# Patient Record
Sex: Female | Born: 1950 | Marital: Married | Smoking: Never smoker
Health system: Southern US, Community
[De-identification: ages and names within clinical notes are randomized; demographics above are authoritative.]

## PROBLEM LIST (undated history)

## (undated) DIAGNOSIS — E7800 Pure hypercholesterolemia, unspecified: Secondary | ICD-10-CM

## (undated) DIAGNOSIS — E119 Type 2 diabetes mellitus without complications: Secondary | ICD-10-CM

## (undated) HISTORY — DX: Type 2 diabetes mellitus without complications: E11.9

## (undated) HISTORY — DX: Pure hypercholesterolemia, unspecified: E78.00

---

## 2013-06-18 ENCOUNTER — Ambulatory Visit (INDEPENDENT_AMBULATORY_CARE_PROVIDER_SITE_OTHER): Payer: PRIVATE HEALTH INSURANCE | Admitting: Diagnostic Neuroimaging

## 2013-06-18 ENCOUNTER — Encounter (INDEPENDENT_AMBULATORY_CARE_PROVIDER_SITE_OTHER): Payer: Self-pay

## 2013-06-18 ENCOUNTER — Encounter: Payer: Self-pay | Admitting: Diagnostic Neuroimaging

## 2013-06-18 VITALS — BP 134/95 | HR 108 | Temp 97.8°F | Ht 61.0 in | Wt 149.0 lb

## 2013-06-18 DIAGNOSIS — M5416 Radiculopathy, lumbar region: Secondary | ICD-10-CM

## 2013-06-18 DIAGNOSIS — M545 Low back pain, unspecified: Secondary | ICD-10-CM

## 2013-06-18 DIAGNOSIS — IMO0002 Reserved for concepts with insufficient information to code with codable children: Secondary | ICD-10-CM

## 2013-06-18 DIAGNOSIS — M79604 Pain in right leg: Secondary | ICD-10-CM

## 2013-06-18 DIAGNOSIS — M79609 Pain in unspecified limb: Secondary | ICD-10-CM

## 2013-06-18 MED ORDER — GABAPENTIN 300 MG PO CAPS: 300.0000 mg | ORAL_CAPSULE | Freq: Three times a day (TID) | ORAL | Status: AC

## 2013-06-18 NOTE — Progress Notes (Signed)
[facility]  PATIENT: Sophia Peterson DOB: 1950-12-06  REFERRING CLINICIAN: holt HISTORY FROM: patient and husband REASON FOR VISIT: new consult   HISTORICAL  CHIEF COMPLAINT:  Chief Complaint  Patient presents with  . Back Pain    R knee and inside of leg    HISTORY OF PRESENT ILLNESS:   63 year old female with diabetes and hyperkalemia, here for evaluation of right leg pain.  5 weeks ago patient was cleaning snow off of her [place]i with a broom. She had significant physical exertion. Later that day and the following day, she noticed significant pain in her right knee, right thigh, right hip. Patient then developed an electrical sensation in the medial aspect of the right knee. Since that time she has lost sensation in the right leg from her hip to her knee. No significant weakness. She has difficult time walking. No problems with her left leg. She has chronic low back pain, including disc bulging from 2010. She's been using ibuprofen recently. Patient was treated with cortisone injection without relief.  REVIEW OF SYSTEMS: Full 14 system review of systems performed and notable only for patient was willing swelling or legs weakness diffusely.  ALLERGIES: Allergies  Allergen Reactions  . Sulfa Antibiotics     HOME MEDICATIONS: No outpatient prescriptions prior to visit.   No facility-administered medications prior to visit.    PAST MEDICAL HISTORY: Past Medical History  Diagnosis Date  . Diabetes   . High cholesterol     PAST SURGICAL HISTORY: Past Surgical History  Procedure Laterality Date  . Cesarean section  1976, 1980    FAMILY HISTORY: Family History  Problem Relation Age of Onset  . Colon cancer Mother   . Kidney failure Sister     SOCIAL HISTORY:  History   Social History  . Marital Status: Married    Spouse Name: Marcello Moores    Number of Children: 2  . Years of Education: 12th   Occupational History  .  Other    [place] History Main Topics  . Smoking status: Never Smoker   . Smokeless tobacco: Never Used  . Alcohol Use: No  . Drug Use: No  . Sexual Activity: Not on file   Other Topics Concern  . Not on file   Social History Narrative   Patient lives at home with spouse.   Caffeine Use:1-2 cups daily     PHYSICAL EXAM  Filed Vitals:   06/18/13 1038  BP: 134/95  Pulse: 108  Temp: 97.8 F (36.6 C)  TempSrc: Oral  Height: 5\' 1"  (1.549 m)  Weight: 149 lb (67.586 kg)    Not recorded    Body mass index is 28.17 kg/(m^2).  GENERAL EXAM: Patient is in no distress; well developed, nourished and groomed; neck is supple  CARDIOVASCULAR: Regular rate and rhythm, no murmurs, no carotid bruits  NEUROLOGIC: MENTAL STATUS: awake, alert, oriented to person, place and time, recent and remote memory intact, normal attention and concentration, language fluent, comprehension intact, naming intact, fund of knowledge appropriate CRANIAL NERVE: no papilledema on fundoscopic exam, pupils equal and reactive to light, visual fields full to confrontation, extraocular muscles intact, no nystagmus, facial sensation and strength symmetric, hearing intact, palate elevates symmetrically, uvula midline, shoulder shrug symmetric, tongue midline. MOTOR: normal bulk and tone, full strength in the BUE, BLE SENSORY: DECR TO ALL MODALITIES IN RIGHT L4 DISTRIBUTION; BUE AND LLE NORMAL. COORDINATION: finger-nose-finger, fine finger movements, heel-shin normal REFLEXES: BUE  3+ (WITH SPREAD), POSITIVE HOFFMANS; RIGHT KNEE 3+, RIGHT ANKLE 4 WITH FEW BEATS CLONUS; LEFT KNEE 3, LEFT ANKLE 2. DOWN GOING TOES.  GAIT/STATION: ANTALGIC GAIT; LIMPS ON RIGHT LEG. UNSTEADY.    DIAGNOSTIC DATA (LABS, IMAGING, TESTING) - I reviewed patient records, labs, notes, testing and imaging myself where available.  No results found for this basename: WBC, HGB, HCT, MCV, PLT   No results found for this basename:  na, k, cl, co2, glucose, bun, creatinine, calcium, prot, albumin, ast, alt, alkphos, bilitot, gfrnonaa, gfraa   No results found for this basename: CHOL, HDL, LDLCALC, LDLDIRECT, TRIG, CHOLHDL   No results found for this basename: HGBA1C   No results found for this basename: VITAMINB12   No results found for this basename: TSH      ASSESSMENT AND PLAN  63 y.o. year old female here with previous history low back pain and lumbar disc bulging, now with new onset 5 weeks of right lower extremity pain and numbness. Neurologic examination notable for decreased sensation in the right leg in the L4 distribution, normal strength, diffuse hyperreflexia in the upper and lower extremities with asymmetric increased reflex in the right leg. Findings concerning for right lumbar radiculopathy with superimposed cervical myelopathy.  PLAN: - out of work for next 6 weeks - MRI scans - gabapentin - PT evaluation   Orders Placed This Encounter  Procedures  . MR Lumbar Spine Wo Contrast  . MR Cervical Spine Wo Contrast    Return in about 3 months (around 09/17/2013).    Penni Bombard, MD 03/23/4816, 56:31 AM Certified in Neurology, Neurophysiology and Neuroimaging  [facility] [address] [place], [place] [address] [phone]

## 2013-06-18 NOTE — Patient Instructions (Signed)
Continue physical therapy.  Try gabapentin 300mg  at bedtime; gradually increase to three times per day.  I will check MRI scans.

## 2013-06-19 DIAGNOSIS — M47812 Spondylosis without myelopathy or radiculopathy, cervical region: Secondary | ICD-10-CM | POA: Insufficient documentation

## 2013-06-24 ENCOUNTER — Other Ambulatory Visit: Payer: Self-pay

## 2013-06-28 ENCOUNTER — Ambulatory Visit
Admission: RE | Admit: 2013-06-28 | Discharge: 2013-06-28 | Disposition: A | Discharge status: Home / Self Care | Payer: PRIVATE HEALTH INSURANCE | Source: Ambulatory Visit | Attending: Diagnostic Neuroimaging | Admitting: Diagnostic Neuroimaging

## 2013-06-28 DIAGNOSIS — M545 Low back pain, unspecified: Secondary | ICD-10-CM

## 2013-06-28 DIAGNOSIS — M79604 Pain in right leg: Secondary | ICD-10-CM

## 2013-06-28 DIAGNOSIS — R209 Unspecified disturbances of skin sensation: Secondary | ICD-10-CM

## 2013-06-28 DIAGNOSIS — M5416 Radiculopathy, lumbar region: Secondary | ICD-10-CM

## 2013-07-02 ENCOUNTER — Telehealth: Payer: Self-pay | Admitting: Diagnostic Neuroimaging

## 2013-07-02 DIAGNOSIS — R531 Weakness: Secondary | ICD-10-CM

## 2013-07-02 DIAGNOSIS — R292 Abnormal reflex: Secondary | ICD-10-CM

## 2013-07-02 NOTE — Telephone Encounter (Signed)
Patient calling requesting results of recent MRI. Please call to advise.

## 2013-07-07 NOTE — Telephone Encounter (Signed)
MRIs show multiple areas of disc bulging and possible pinched nerves. Can proceed with conservative vs neurosurgery evaluation. Would rec conservative for now. Also, I will check MRI brain to evaluate for her "jumpy" reflexes.  Penni Bombard, MD 9/67/8938, 1:01 PM Certified in Neurology, Neurophysiology and Neuroimaging  [facility] [address] [place], [place] [address] [phone]

## 2013-07-08 ENCOUNTER — Telehealth: Payer: Self-pay | Admitting: *Deleted

## 2013-07-08 NOTE — Telephone Encounter (Signed)
Pt calling requesting MRI results. Please advise °

## 2013-07-17 NOTE — Telephone Encounter (Signed)
Pt is calling back concerning results from both of her MRI. Thanks

## 2013-07-18 NOTE — Telephone Encounter (Signed)
I called patient, but no answer. MRIs show multiple areas of disc bulging and possible pinched nerves. Can proceed with conservative vs neurosurgery evaluation. Would rec conservative for now. Also, I will check MRI brain to evaluate for her "jumpy" reflexes. Please let patient know. -VRP

## 2013-07-21 ENCOUNTER — Telehealth: Payer: Self-pay | Admitting: *Deleted

## 2013-07-21 ENCOUNTER — Encounter: Payer: PRIVATE HEALTH INSURANCE | Admitting: Radiology

## 2013-07-21 ENCOUNTER — Encounter: Payer: PRIVATE HEALTH INSURANCE | Admitting: Neurology

## 2013-07-21 NOTE — Telephone Encounter (Signed)
I called pt and relayed the results.   MRI Brain done 07-12-13 at [place] and spoke to [place], she is to fax.  Received and given to Dr. Leta Baptist.   PT order?

## 2013-07-21 NOTE — Telephone Encounter (Signed)
I spoke to pt this afternoon re:  MRI results.

## 2013-07-22 NOTE — Telephone Encounter (Signed)
I called and LMVM for pt that MRI brain results no change from CT back in 2011.  Order for PT will be placed (location in [place]?).  Pt to call back.

## 2013-07-23 ENCOUNTER — Telehealth: Payer: Self-pay | Admitting: *Deleted

## 2013-07-23 DIAGNOSIS — M79604 Pain in right leg: Secondary | ICD-10-CM

## 2013-07-23 DIAGNOSIS — M545 Low back pain, unspecified: Secondary | ICD-10-CM

## 2013-07-23 NOTE — Telephone Encounter (Signed)
Per Dr. Leta Baptist, pt may go back to work when she feels up to it.  Order placed for outpt PT.  I called pt and she is out for 6 wks.   Still waiting on her form for work.  Needs note to return to work.  I will forward message about form status to Hilda Blades S in [place].

## 2013-07-23 NOTE — Telephone Encounter (Signed)
Patient calling to let Lovey Newcomer know she would like to have PT at [facility].  She would like to know if she could return to work before PT or would doctor prefer to have her do PT first.

## 2013-07-30 ENCOUNTER — Encounter: Payer: Self-pay | Admitting: *Deleted

## 2013-09-17 ENCOUNTER — Ambulatory Visit: Payer: PRIVATE HEALTH INSURANCE | Admitting: Diagnostic Neuroimaging

## 2014-02-04 ENCOUNTER — Encounter: Payer: Self-pay | Admitting: Neurology

## 2014-02-10 ENCOUNTER — Encounter: Payer: Self-pay | Admitting: Neurology

## 2016-09-28 DIAGNOSIS — D509 Iron deficiency anemia, unspecified: Secondary | ICD-10-CM | POA: Diagnosis not present

## 2016-09-28 DIAGNOSIS — D649 Anemia, unspecified: Secondary | ICD-10-CM | POA: Diagnosis not present

## 2016-09-28 DIAGNOSIS — D500 Iron deficiency anemia secondary to blood loss (chronic): Secondary | ICD-10-CM | POA: Diagnosis not present

## 2016-09-28 DIAGNOSIS — K5730 Diverticulosis of large intestine without perforation or abscess without bleeding: Secondary | ICD-10-CM | POA: Diagnosis not present

## 2017-06-12 DIAGNOSIS — Z1339 Encounter for screening examination for other mental health and behavioral disorders: Secondary | ICD-10-CM | POA: Diagnosis not present

## 2017-06-12 DIAGNOSIS — Z9181 History of falling: Secondary | ICD-10-CM | POA: Diagnosis not present

## 2017-06-12 DIAGNOSIS — I10 Essential (primary) hypertension: Secondary | ICD-10-CM | POA: Diagnosis not present

## 2017-06-12 DIAGNOSIS — F329 Major depressive disorder, single episode, unspecified: Secondary | ICD-10-CM | POA: Diagnosis not present

## 2017-06-12 DIAGNOSIS — Z6831 Body mass index (BMI) 31.0-31.9, adult: Secondary | ICD-10-CM | POA: Diagnosis not present

## 2017-06-12 DIAGNOSIS — Z1331 Encounter for screening for depression: Secondary | ICD-10-CM | POA: Diagnosis not present

## 2017-06-12 DIAGNOSIS — E1165 Type 2 diabetes mellitus with hyperglycemia: Secondary | ICD-10-CM | POA: Diagnosis not present

## 2017-07-31 DIAGNOSIS — J209 Acute bronchitis, unspecified: Secondary | ICD-10-CM | POA: Diagnosis not present

## 2017-08-04 DIAGNOSIS — J189 Pneumonia, unspecified organism: Secondary | ICD-10-CM | POA: Diagnosis not present

## 2017-08-06 DIAGNOSIS — J189 Pneumonia, unspecified organism: Secondary | ICD-10-CM | POA: Diagnosis not present

## 2017-08-06 DIAGNOSIS — J40 Bronchitis, not specified as acute or chronic: Secondary | ICD-10-CM | POA: Diagnosis not present

## 2017-08-06 DIAGNOSIS — R918 Other nonspecific abnormal finding of lung field: Secondary | ICD-10-CM | POA: Diagnosis not present

## 2017-08-08 DIAGNOSIS — R0602 Shortness of breath: Secondary | ICD-10-CM | POA: Diagnosis not present

## 2017-08-08 DIAGNOSIS — I2699 Other pulmonary embolism without acute cor pulmonale: Secondary | ICD-10-CM | POA: Diagnosis not present

## 2017-08-08 DIAGNOSIS — R9389 Abnormal findings on diagnostic imaging of other specified body structures: Secondary | ICD-10-CM | POA: Diagnosis not present

## 2017-08-10 DIAGNOSIS — J45991 Cough variant asthma: Secondary | ICD-10-CM | POA: Diagnosis not present

## 2017-08-10 DIAGNOSIS — J301 Allergic rhinitis due to pollen: Secondary | ICD-10-CM | POA: Diagnosis not present

## 2017-08-10 DIAGNOSIS — J40 Bronchitis, not specified as acute or chronic: Secondary | ICD-10-CM | POA: Diagnosis not present

## 2017-08-14 DIAGNOSIS — E049 Nontoxic goiter, unspecified: Secondary | ICD-10-CM | POA: Diagnosis not present

## 2017-08-27 DIAGNOSIS — J45991 Cough variant asthma: Secondary | ICD-10-CM | POA: Diagnosis not present

## 2017-08-27 DIAGNOSIS — J40 Bronchitis, not specified as acute or chronic: Secondary | ICD-10-CM | POA: Diagnosis not present

## 2017-08-27 DIAGNOSIS — E041 Nontoxic single thyroid nodule: Secondary | ICD-10-CM | POA: Diagnosis not present

## 2017-12-11 DIAGNOSIS — I10 Essential (primary) hypertension: Secondary | ICD-10-CM | POA: Diagnosis not present

## 2017-12-11 DIAGNOSIS — D440 Neoplasm of uncertain behavior of thyroid gland: Secondary | ICD-10-CM | POA: Diagnosis not present

## 2017-12-11 DIAGNOSIS — E041 Nontoxic single thyroid nodule: Secondary | ICD-10-CM | POA: Diagnosis not present

## 2017-12-11 DIAGNOSIS — R490 Dysphonia: Secondary | ICD-10-CM | POA: Diagnosis not present

## 2017-12-11 DIAGNOSIS — E119 Type 2 diabetes mellitus without complications: Secondary | ICD-10-CM | POA: Diagnosis not present

## 2017-12-19 DIAGNOSIS — E041 Nontoxic single thyroid nodule: Secondary | ICD-10-CM | POA: Diagnosis not present

## 2017-12-19 DIAGNOSIS — J45991 Cough variant asthma: Secondary | ICD-10-CM | POA: Diagnosis not present

## 2017-12-27 DIAGNOSIS — E041 Nontoxic single thyroid nodule: Secondary | ICD-10-CM | POA: Diagnosis not present

## 2018-01-08 DIAGNOSIS — R490 Dysphonia: Secondary | ICD-10-CM | POA: Diagnosis not present

## 2018-01-08 DIAGNOSIS — E119 Type 2 diabetes mellitus without complications: Secondary | ICD-10-CM | POA: Diagnosis not present

## 2018-01-08 DIAGNOSIS — E041 Nontoxic single thyroid nodule: Secondary | ICD-10-CM | POA: Diagnosis not present

## 2018-01-08 DIAGNOSIS — I10 Essential (primary) hypertension: Secondary | ICD-10-CM | POA: Diagnosis not present

## 2018-01-25 DIAGNOSIS — J069 Acute upper respiratory infection, unspecified: Secondary | ICD-10-CM | POA: Diagnosis not present

## 2018-01-25 DIAGNOSIS — J029 Acute pharyngitis, unspecified: Secondary | ICD-10-CM | POA: Diagnosis not present

## 2018-01-26 DIAGNOSIS — J22 Unspecified acute lower respiratory infection: Secondary | ICD-10-CM | POA: Diagnosis not present

## 2018-01-29 DIAGNOSIS — Z1331 Encounter for screening for depression: Secondary | ICD-10-CM | POA: Diagnosis not present

## 2018-01-29 DIAGNOSIS — Z0000 Encounter for general adult medical examination without abnormal findings: Secondary | ICD-10-CM | POA: Diagnosis not present

## 2018-01-29 DIAGNOSIS — Z6830 Body mass index (BMI) 30.0-30.9, adult: Secondary | ICD-10-CM | POA: Diagnosis not present

## 2018-01-29 DIAGNOSIS — Z1339 Encounter for screening examination for other mental health and behavioral disorders: Secondary | ICD-10-CM | POA: Diagnosis not present

## 2018-01-29 DIAGNOSIS — R05 Cough: Secondary | ICD-10-CM | POA: Diagnosis not present

## 2018-01-30 DIAGNOSIS — Z01810 Encounter for preprocedural cardiovascular examination: Secondary | ICD-10-CM | POA: Diagnosis not present

## 2018-01-30 DIAGNOSIS — Z01812 Encounter for preprocedural laboratory examination: Secondary | ICD-10-CM | POA: Diagnosis not present

## 2018-01-30 DIAGNOSIS — R9431 Abnormal electrocardiogram [ECG] [EKG]: Secondary | ICD-10-CM | POA: Diagnosis not present

## 2018-02-06 DIAGNOSIS — E785 Hyperlipidemia, unspecified: Secondary | ICD-10-CM | POA: Diagnosis not present

## 2018-02-06 DIAGNOSIS — I10 Essential (primary) hypertension: Secondary | ICD-10-CM | POA: Diagnosis not present

## 2018-02-06 DIAGNOSIS — Z01810 Encounter for preprocedural cardiovascular examination: Secondary | ICD-10-CM | POA: Diagnosis not present

## 2018-02-06 DIAGNOSIS — R9431 Abnormal electrocardiogram [ECG] [EKG]: Secondary | ICD-10-CM | POA: Diagnosis not present

## 2018-02-11 DIAGNOSIS — R9431 Abnormal electrocardiogram [ECG] [EKG]: Secondary | ICD-10-CM | POA: Diagnosis not present

## 2018-03-18 DIAGNOSIS — D440 Neoplasm of uncertain behavior of thyroid gland: Secondary | ICD-10-CM | POA: Diagnosis not present

## 2018-03-18 DIAGNOSIS — I10 Essential (primary) hypertension: Secondary | ICD-10-CM | POA: Diagnosis not present

## 2018-03-18 DIAGNOSIS — J45909 Unspecified asthma, uncomplicated: Secondary | ICD-10-CM | POA: Diagnosis not present

## 2018-03-18 DIAGNOSIS — R1311 Dysphagia, oral phase: Secondary | ICD-10-CM | POA: Diagnosis not present

## 2018-03-18 DIAGNOSIS — E041 Nontoxic single thyroid nodule: Secondary | ICD-10-CM | POA: Diagnosis not present

## 2018-03-18 DIAGNOSIS — E785 Hyperlipidemia, unspecified: Secondary | ICD-10-CM | POA: Diagnosis not present

## 2018-03-18 DIAGNOSIS — E119 Type 2 diabetes mellitus without complications: Secondary | ICD-10-CM | POA: Diagnosis not present

## 2018-03-18 DIAGNOSIS — Z8673 Personal history of transient ischemic attack (TIA), and cerebral infarction without residual deficits: Secondary | ICD-10-CM | POA: Diagnosis not present

## 2018-03-18 DIAGNOSIS — R1319 Other dysphagia: Secondary | ICD-10-CM | POA: Diagnosis not present

## 2018-03-18 DIAGNOSIS — K219 Gastro-esophageal reflux disease without esophagitis: Secondary | ICD-10-CM | POA: Diagnosis not present

## 2018-03-18 DIAGNOSIS — D34 Benign neoplasm of thyroid gland: Secondary | ICD-10-CM | POA: Diagnosis not present

## 2018-03-19 DIAGNOSIS — E785 Hyperlipidemia, unspecified: Secondary | ICD-10-CM | POA: Diagnosis not present

## 2018-03-19 DIAGNOSIS — R1319 Other dysphagia: Secondary | ICD-10-CM | POA: Diagnosis not present

## 2018-03-19 DIAGNOSIS — J45909 Unspecified asthma, uncomplicated: Secondary | ICD-10-CM | POA: Diagnosis not present

## 2018-03-19 DIAGNOSIS — Z8673 Personal history of transient ischemic attack (TIA), and cerebral infarction without residual deficits: Secondary | ICD-10-CM | POA: Diagnosis not present

## 2018-03-19 DIAGNOSIS — K219 Gastro-esophageal reflux disease without esophagitis: Secondary | ICD-10-CM | POA: Diagnosis not present

## 2018-03-19 DIAGNOSIS — E119 Type 2 diabetes mellitus without complications: Secondary | ICD-10-CM | POA: Diagnosis not present

## 2018-03-19 DIAGNOSIS — I10 Essential (primary) hypertension: Secondary | ICD-10-CM | POA: Diagnosis not present

## 2018-03-19 DIAGNOSIS — E041 Nontoxic single thyroid nodule: Secondary | ICD-10-CM | POA: Diagnosis not present

## 2018-07-25 DIAGNOSIS — Z79899 Other long term (current) drug therapy: Secondary | ICD-10-CM | POA: Diagnosis not present

## 2018-07-25 DIAGNOSIS — E785 Hyperlipidemia, unspecified: Secondary | ICD-10-CM | POA: Diagnosis not present

## 2018-07-25 DIAGNOSIS — K219 Gastro-esophageal reflux disease without esophagitis: Secondary | ICD-10-CM | POA: Diagnosis not present

## 2018-07-25 DIAGNOSIS — E1169 Type 2 diabetes mellitus with other specified complication: Secondary | ICD-10-CM | POA: Diagnosis not present

## 2018-07-25 DIAGNOSIS — Z6831 Body mass index (BMI) 31.0-31.9, adult: Secondary | ICD-10-CM | POA: Diagnosis not present

## 2018-07-25 DIAGNOSIS — F329 Major depressive disorder, single episode, unspecified: Secondary | ICD-10-CM | POA: Diagnosis not present

## 2018-07-25 DIAGNOSIS — E781 Pure hyperglyceridemia: Secondary | ICD-10-CM | POA: Diagnosis not present

## 2018-07-25 DIAGNOSIS — I10 Essential (primary) hypertension: Secondary | ICD-10-CM | POA: Diagnosis not present

## 2018-07-25 DIAGNOSIS — Z1331 Encounter for screening for depression: Secondary | ICD-10-CM | POA: Diagnosis not present

## 2018-08-01 DIAGNOSIS — R258 Other abnormal involuntary movements: Secondary | ICD-10-CM | POA: Diagnosis not present

## 2018-08-01 DIAGNOSIS — Z6831 Body mass index (BMI) 31.0-31.9, adult: Secondary | ICD-10-CM | POA: Diagnosis not present

## 2018-09-17 DIAGNOSIS — I10 Essential (primary) hypertension: Secondary | ICD-10-CM | POA: Diagnosis not present

## 2018-09-17 DIAGNOSIS — E1169 Type 2 diabetes mellitus with other specified complication: Secondary | ICD-10-CM | POA: Diagnosis not present

## 2018-09-17 DIAGNOSIS — E785 Hyperlipidemia, unspecified: Secondary | ICD-10-CM | POA: Diagnosis not present

## 2018-09-21 ENCOUNTER — Inpatient Hospital Stay (HOSPITAL_COMMUNITY)
Admission: EM | Admit: 2018-09-21 | Discharge: 2018-10-19 | DRG: 023 | Disposition: E | Payer: Medicare HMO | Attending: Neurology | Admitting: Neurology

## 2018-09-21 ENCOUNTER — Emergency Department (HOSPITAL_COMMUNITY): Payer: Medicare HMO

## 2018-09-21 DIAGNOSIS — I639 Cerebral infarction, unspecified: Secondary | ICD-10-CM | POA: Diagnosis not present

## 2018-09-21 DIAGNOSIS — E441 Mild protein-calorie malnutrition: Secondary | ICD-10-CM | POA: Diagnosis present

## 2018-09-21 DIAGNOSIS — J90 Pleural effusion, not elsewhere classified: Secondary | ICD-10-CM | POA: Diagnosis not present

## 2018-09-21 DIAGNOSIS — I609 Nontraumatic subarachnoid hemorrhage, unspecified: Secondary | ICD-10-CM | POA: Diagnosis present

## 2018-09-21 DIAGNOSIS — G935 Compression of brain: Secondary | ICD-10-CM | POA: Diagnosis present

## 2018-09-21 DIAGNOSIS — Z515 Encounter for palliative care: Secondary | ICD-10-CM | POA: Diagnosis not present

## 2018-09-21 DIAGNOSIS — R4182 Altered mental status, unspecified: Secondary | ICD-10-CM | POA: Diagnosis not present

## 2018-09-21 DIAGNOSIS — Z03818 Encounter for observation for suspected exposure to other biological agents ruled out: Secondary | ICD-10-CM | POA: Diagnosis not present

## 2018-09-21 DIAGNOSIS — G911 Obstructive hydrocephalus: Secondary | ICD-10-CM | POA: Diagnosis present

## 2018-09-21 DIAGNOSIS — E1122 Type 2 diabetes mellitus with diabetic chronic kidney disease: Secondary | ICD-10-CM | POA: Diagnosis present

## 2018-09-21 DIAGNOSIS — J9600 Acute respiratory failure, unspecified whether with hypoxia or hypercapnia: Secondary | ICD-10-CM | POA: Diagnosis present

## 2018-09-21 DIAGNOSIS — I161 Hypertensive emergency: Secondary | ICD-10-CM | POA: Diagnosis present

## 2018-09-21 DIAGNOSIS — I6200 Nontraumatic subdural hemorrhage, unspecified: Secondary | ICD-10-CM | POA: Diagnosis not present

## 2018-09-21 DIAGNOSIS — I619 Nontraumatic intracerebral hemorrhage, unspecified: Secondary | ICD-10-CM | POA: Diagnosis not present

## 2018-09-21 DIAGNOSIS — E1165 Type 2 diabetes mellitus with hyperglycemia: Secondary | ICD-10-CM | POA: Diagnosis present

## 2018-09-21 DIAGNOSIS — Z800 Family history of malignant neoplasm of digestive organs: Secondary | ICD-10-CM | POA: Diagnosis not present

## 2018-09-21 DIAGNOSIS — J9690 Respiratory failure, unspecified, unspecified whether with hypoxia or hypercapnia: Secondary | ICD-10-CM

## 2018-09-21 DIAGNOSIS — G936 Cerebral edema: Secondary | ICD-10-CM | POA: Diagnosis present

## 2018-09-21 DIAGNOSIS — R402142 Coma scale, eyes open, spontaneous, at arrival to emergency department: Secondary | ICD-10-CM | POA: Diagnosis present

## 2018-09-21 DIAGNOSIS — Z66 Do not resuscitate: Secondary | ICD-10-CM | POA: Diagnosis not present

## 2018-09-21 DIAGNOSIS — R402212 Coma scale, best verbal response, none, at arrival to emergency department: Secondary | ICD-10-CM | POA: Diagnosis present

## 2018-09-21 DIAGNOSIS — Z978 Presence of other specified devices: Secondary | ICD-10-CM | POA: Diagnosis not present

## 2018-09-21 DIAGNOSIS — G919 Hydrocephalus, unspecified: Secondary | ICD-10-CM | POA: Diagnosis not present

## 2018-09-21 DIAGNOSIS — I629 Nontraumatic intracranial hemorrhage, unspecified: Secondary | ICD-10-CM | POA: Diagnosis not present

## 2018-09-21 DIAGNOSIS — Z1159 Encounter for screening for other viral diseases: Secondary | ICD-10-CM | POA: Diagnosis not present

## 2018-09-21 DIAGNOSIS — I610 Nontraumatic intracerebral hemorrhage in hemisphere, subcortical: Secondary | ICD-10-CM

## 2018-09-21 DIAGNOSIS — I615 Nontraumatic intracerebral hemorrhage, intraventricular: Principal | ICD-10-CM | POA: Diagnosis present

## 2018-09-21 DIAGNOSIS — E785 Hyperlipidemia, unspecified: Secondary | ICD-10-CM | POA: Diagnosis present

## 2018-09-21 DIAGNOSIS — G8191 Hemiplegia, unspecified affecting right dominant side: Secondary | ICD-10-CM | POA: Diagnosis present

## 2018-09-21 DIAGNOSIS — J690 Pneumonitis due to inhalation of food and vomit: Secondary | ICD-10-CM | POA: Diagnosis present

## 2018-09-21 DIAGNOSIS — I618 Other nontraumatic intracerebral hemorrhage: Secondary | ICD-10-CM | POA: Diagnosis not present

## 2018-09-21 DIAGNOSIS — Z794 Long term (current) use of insulin: Secondary | ICD-10-CM

## 2018-09-21 DIAGNOSIS — Z982 Presence of cerebrospinal fluid drainage device: Secondary | ICD-10-CM | POA: Diagnosis not present

## 2018-09-21 DIAGNOSIS — E7800 Pure hypercholesterolemia, unspecified: Secondary | ICD-10-CM | POA: Diagnosis present

## 2018-09-21 DIAGNOSIS — I129 Hypertensive chronic kidney disease with stage 1 through stage 4 chronic kidney disease, or unspecified chronic kidney disease: Secondary | ICD-10-CM | POA: Diagnosis present

## 2018-09-21 DIAGNOSIS — E876 Hypokalemia: Secondary | ICD-10-CM | POA: Diagnosis not present

## 2018-09-21 DIAGNOSIS — G9340 Encephalopathy, unspecified: Secondary | ICD-10-CM | POA: Diagnosis not present

## 2018-09-21 DIAGNOSIS — R402362 Coma scale, best motor response, obeys commands, at arrival to emergency department: Secondary | ICD-10-CM | POA: Diagnosis present

## 2018-09-21 DIAGNOSIS — R29713 NIHSS score 13: Secondary | ICD-10-CM | POA: Diagnosis present

## 2018-09-21 DIAGNOSIS — R918 Other nonspecific abnormal finding of lung field: Secondary | ICD-10-CM | POA: Diagnosis not present

## 2018-09-21 DIAGNOSIS — Z4682 Encounter for fitting and adjustment of non-vascular catheter: Secondary | ICD-10-CM | POA: Diagnosis not present

## 2018-09-21 DIAGNOSIS — G9389 Other specified disorders of brain: Secondary | ICD-10-CM | POA: Diagnosis not present

## 2018-09-21 DIAGNOSIS — I6622 Occlusion and stenosis of left posterior cerebral artery: Secondary | ICD-10-CM | POA: Diagnosis not present

## 2018-09-21 DIAGNOSIS — N189 Chronic kidney disease, unspecified: Secondary | ICD-10-CM | POA: Diagnosis present

## 2018-09-21 DIAGNOSIS — S199XXA Unspecified injury of neck, initial encounter: Secondary | ICD-10-CM | POA: Diagnosis not present

## 2018-09-21 DIAGNOSIS — I613 Nontraumatic intracerebral hemorrhage in brain stem: Secondary | ICD-10-CM | POA: Diagnosis not present

## 2018-09-21 DIAGNOSIS — Z6828 Body mass index (BMI) 28.0-28.9, adult: Secondary | ICD-10-CM

## 2018-09-21 DIAGNOSIS — R600 Localized edema: Secondary | ICD-10-CM | POA: Diagnosis not present

## 2018-09-21 DIAGNOSIS — S3993XA Unspecified injury of pelvis, initial encounter: Secondary | ICD-10-CM | POA: Diagnosis not present

## 2018-09-21 DIAGNOSIS — J9811 Atelectasis: Secondary | ICD-10-CM | POA: Diagnosis not present

## 2018-09-21 LAB — DIFFERENTIAL
Abs Immature Granulocytes: 0.07 10*3/uL (ref 0.00–0.07)
Basophils Absolute: 0 10*3/uL (ref 0.0–0.1)
Basophils Relative: 0 %
Eosinophils Absolute: 0.2 10*3/uL (ref 0.0–0.5)
Eosinophils Relative: 2 %
Immature Granulocytes: 1 %
Lymphocytes Relative: 18 %
Lymphs Abs: 2.1 10*3/uL (ref 0.7–4.0)
Monocytes Absolute: 0.5 10*3/uL (ref 0.1–1.0)
Monocytes Relative: 4 %
Neutro Abs: 9.1 10*3/uL — ABNORMAL HIGH (ref 1.7–7.7)
Neutrophils Relative %: 75 %

## 2018-09-21 LAB — COMPREHENSIVE METABOLIC PANEL
ALT: 18 U/L (ref 0–44)
AST: 24 U/L (ref 15–41)
Albumin: 4.1 g/dL (ref 3.5–5.0)
Alkaline Phosphatase: 39 U/L (ref 38–126)
Anion gap: 12 (ref 5–15)
BUN: 25 mg/dL — ABNORMAL HIGH (ref 8–23)
CO2: 21 mmol/L — ABNORMAL LOW (ref 22–32)
Calcium: 9 mg/dL (ref 8.9–10.3)
Chloride: 100 mmol/L (ref 98–111)
Creatinine, Ser: 1.18 mg/dL — ABNORMAL HIGH (ref 0.44–1.00)
GFR calc Af Amer: 56 mL/min — ABNORMAL LOW (ref >60)
GFR calc non Af Amer: 48 mL/min — ABNORMAL LOW (ref >60)
Glucose, Bld: 260 mg/dL — ABNORMAL HIGH (ref 70–99)
Potassium: 3.5 mmol/L (ref 3.5–5.1)
Sodium: 133 mmol/L — ABNORMAL LOW (ref 135–145)
Total Bilirubin: 0.6 mg/dL (ref 0.3–1.2)
Total Protein: 7.1 g/dL (ref 6.5–8.1)

## 2018-09-21 LAB — CBC
HCT: 32.1 % — ABNORMAL LOW (ref 36.0–46.0)
Hemoglobin: 10.4 g/dL — ABNORMAL LOW (ref 12.0–15.0)
MCH: 28.6 pg (ref 26.0–34.0)
MCHC: 32.4 g/dL (ref 30.0–36.0)
MCV: 88.2 fL (ref 80.0–100.0)
Platelets: 304 10*3/uL (ref 150–400)
RBC: 3.64 MIL/uL — ABNORMAL LOW (ref 3.87–5.11)
RDW: 13.2 % (ref 11.5–15.5)
WBC: 12.1 10*3/uL — ABNORMAL HIGH (ref 4.0–10.5)
nRBC: 0 % (ref 0.0–0.2)

## 2018-09-21 LAB — CBG MONITORING, ED: Glucose-Capillary: 257 mg/dL — ABNORMAL HIGH (ref 70–99)

## 2018-09-21 LAB — I-STAT CHEM 8, ED
BUN: 24 mg/dL — ABNORMAL HIGH (ref 8–23)
Calcium, Ion: 1.09 mmol/L — ABNORMAL LOW (ref 1.15–1.40)
Chloride: 101 mmol/L (ref 98–111)
Creatinine, Ser: 1.1 mg/dL — ABNORMAL HIGH (ref 0.44–1.00)
Glucose, Bld: 262 mg/dL — ABNORMAL HIGH (ref 70–99)
HCT: 33 % — ABNORMAL LOW (ref 36.0–46.0)
Hemoglobin: 11.2 g/dL — ABNORMAL LOW (ref 12.0–15.0)
Potassium: 3.6 mmol/L (ref 3.5–5.1)
Sodium: 135 mmol/L (ref 135–145)
TCO2: 24 mmol/L (ref 22–32)

## 2018-09-21 LAB — APTT: aPTT: 28 seconds (ref 24–36)

## 2018-09-21 LAB — ETHANOL: Alcohol, Ethyl (B): <10 mg/dL (ref <10)

## 2018-09-21 LAB — SARS CORONAVIRUS 2 BY RT PCR (HOSPITAL ORDER, PERFORMED IN ~~LOC~~ HOSPITAL LAB): SARS Coronavirus 2: NEGATIVE

## 2018-09-21 LAB — PROTIME-INR
INR: 0.9 (ref 0.8–1.2)
Prothrombin Time: 12.1 seconds (ref 11.4–15.2)

## 2018-09-21 MED ORDER — FENTANYL CITRATE (PF) 100 MCG/2ML IJ SOLN
INTRAMUSCULAR | Status: AC
  Administered 2018-09-21: 50 ug
  Filled 2018-09-21: qty 2

## 2018-09-21 MED ORDER — ACETAMINOPHEN 160 MG/5ML PO SOLN
650.0000 mg | ORAL | Status: DC | PRN
  Administered 2018-09-22 – 2018-09-24 (×7): 650 mg
  Filled 2018-09-21 (×7): qty 20.3

## 2018-09-21 MED ORDER — PANTOPRAZOLE SODIUM 40 MG IV SOLR
40.0000 mg | Freq: Every day | INTRAVENOUS | Status: DC
  Administered 2018-09-22 (×2): 40 mg via INTRAVENOUS
  Filled 2018-09-21 (×2): qty 40

## 2018-09-21 MED ORDER — CLEVIDIPINE BUTYRATE 0.5 MG/ML IV EMUL: 0.0000 mg/h | INTRAVENOUS | Status: DC

## 2018-09-21 MED ORDER — SENNOSIDES-DOCUSATE SODIUM 8.6-50 MG PO TABS
1.0000 | ORAL_TABLET | Freq: Two times a day (BID) | ORAL | Status: DC
  Administered 2018-09-22: 02:00:00 1 via ORAL
  Filled 2018-09-21: qty 1

## 2018-09-21 MED ORDER — ACETAMINOPHEN 650 MG RE SUPP
650.0000 mg | RECTAL | Status: DC | PRN
  Administered 2018-09-22 – 2018-09-23 (×3): 650 mg via RECTAL
  Filled 2018-09-21 (×3): qty 1

## 2018-09-21 MED ORDER — MIDAZOLAM HCL 2 MG/2ML IJ SOLN
INTRAMUSCULAR | Status: AC
  Administered 2018-09-21: 23:00:00 5 mg
  Filled 2018-09-21: qty 6

## 2018-09-21 MED ORDER — STROKE: EARLY STAGES OF RECOVERY BOOK
Freq: Once | Status: AC
  Administered 2018-09-22: 05:00:00
  Filled 2018-09-21: qty 1

## 2018-09-21 MED ORDER — LABETALOL HCL 5 MG/ML IV SOLN
20.0000 mg | Freq: Once | INTRAVENOUS | Status: AC
  Administered 2018-09-22: 20 mg via INTRAVENOUS
  Filled 2018-09-21: qty 4

## 2018-09-21 MED ORDER — MANNITOL 20 % IV SOLN
50.0000 g | Freq: Once | INTRAVENOUS | Status: AC
  Administered 2018-09-22: 50 g via INTRAVENOUS
  Filled 2018-09-21 (×3): qty 250

## 2018-09-21 MED ORDER — IOHEXOL 350 MG/ML SOLN
100.0000 mL | Freq: Once | INTRAVENOUS | Status: AC | PRN
  Administered 2018-09-21: 100 mL via INTRAVENOUS

## 2018-09-21 MED ORDER — ACETAMINOPHEN 325 MG PO TABS: 650.0000 mg | ORAL_TABLET | ORAL | Status: DC | PRN

## 2018-09-21 MED ORDER — MIDAZOLAM HCL 2 MG/2ML IJ SOLN
4.0000 mg | INTRAMUSCULAR | Status: DC | PRN
  Administered 2018-09-22: 4 mg via INTRAVENOUS

## 2018-09-21 NOTE — ED Notes (Signed)
Family updated as to patient's status by Dr. Rory Percy

## 2018-09-21 NOTE — ED Provider Notes (Signed)
Procedure Name: Intubation Date/Time: 10/11/2018 10:40 PM Performed by: Jefm Petty, MD Pre-anesthesia Checklist: Patient identified, Emergency Drugs available, Suction available and Patient being monitored Oxygen Delivery Method: Nasal cannula Preoxygenation: Pre-oxygenation with 100% oxygen Induction Type: Rapid sequence and IV induction Ventilation: Mask ventilation without difficulty Laryngoscope Size: Glidescope and 3 Grade View: Grade I Tube size: 7.5 mm Number of attempts: 1 Airway Equipment and Method: Stylet,  Rigid stylet and Video-laryngoscopy Placement Confirmation: ETT inserted through vocal cords under direct vision,  Positive ETCO2,  CO2 detector and Breath sounds checked- equal and bilateral Secured at: 22 cm Tube secured with: ETT holder Difficulty Due To: Difficulty was unanticipated Comments: The care of this patient was supervised by Dr. Lennice Sites, who agreed with the plan and management of the patient.         Jefm Petty, MD 09/20/2018 2248    Lennice Sites, DO 10/08/2018 2303

## 2018-09-21 NOTE — H&P (Signed)
STROKE ICH H&P  CC: fall, AMS  History is obtained from: Chart (at time of dictation this chart is MRN: [id number], and she has another record-[id number])  HPI: Sophia Peterson is a 68 y.o. female past medical history of hypertension, diabetes, hyperlipidemia, in her usual state of health till about 9:15 PM when she went into her garden and had a sudden onset of collapse. She was brought into the hospital via [facility] as a level 2 trauma because of having sustained a fall but on initial evaluation by the ED provider and trauma doctors, she is not moving the right side of her body as much as the left and had a gaze to the right for which a code stroke was activated after discussing with me.  Of note, she had also had a large amount of vomitus upon arrival to the ER. I saw and evaluated the patient in the trauma room-my exam as listed below. I was able to speak with the son Mr. Sophia Peterson, who was present in the waiting room and provided some history.  She lives with her husband who is elderly.  2 sons check up on her-Mr. Sophia Peterson and Mr. Sophia Peterson. Son's phone numbers: Donzetta Starch Son's phone number: Sophia Peterson- [phone]   LKW: 9:15 PM on September 21, 2018 tpa given?: no, ICH Premorbid modified Rankin scale (mRS): 0 ICH score-2  ROS:  Unable to obtain due to altered mental status.   No past medical history on file.  Obtained from the other medical record as listed above.  Hypertension, diabetes, hypercholesterolemia, back pain  No family history on file. Patient unable to provide. In the other chart- mother with colon cancer.  No other family member with neurological disease.  Social History:   has no history on file for tobacco, alcohol, and drug. Patient unable to provide at this time.  On her prior chart, no history of smoking alcohol or drug abuse.  Medications  Current Facility-Administered Medications:  .  midazolam (VERSED) 2 MG/2ML injection, , , ,  No current  outpatient medications on file. Home medication list from the other chart -Atorvastatin, fenofibrate, fluoxetine, gabapentin, hydrocodone/acetaminophen, ibuprofen, omeprazole, sitagliptin, valsartan-hydrochlorothiazide.  Exam: Current vital signs: Pulse 83   Resp (!) 21   SpO2 99%  Vital signs in last 24 hours: Pulse Rate:  [83] 83 (07/04 2233) Resp:  [21] 21 (07/04 2233) SpO2:  [99 %] 99 % (07/04 2233) FiO2 (%):  [60 %] 60 % (07/04 2233) General: Drowsy, appears stressed HEENT: Normocephalic atraumatic, neck in collar Lungs: Breathing normally but appears to have difficulty protecting airway and was debated shortly after I got a brief exam CVS: S1-S2 heard regular rate rhythm Abdomen: Nondistended nontender Neurological exam Patient is drowsy Opens eyes to noxious stimulation and verbal commands Follow some commands on the left but not on the right. Cranial nerves: Pupils equal round reactive to light, she has a right forced gaze, does not blink to threat from either side consistently, her face is symmetric. Motor exam: She is spontaneously moving the left upper and lower extremity and moves left upper extremity to command is able to hold it up for 10 seconds.  She is able to wiggle toes on left lower extremity.  No movement noticed on the right upper and lower extremity spontaneously but on noxious stimulation she does attempt to withdraw the right lower extremity and extends the right upper extremity. Sensory exam: As above Coordination cannot be tested NIH stroke scale 1a Level  of Conscious.: 1 1b LOC Questions: 2 1c LOC Commands:0 2 Best Gaze: 2 3 Visual: 0 4 Facial Palsy: 0 5a Motor Arm - left: 0 5b Motor Arm - Right: 3 6a Motor Leg - Left: 1 6b Motor Leg - Right: 3 7 Limb Ataxia: 0 8 Sensory: 0 9 Best Language: 2 10 Dysarthria: 0 11 Extinct. and Inatten.: 0 TOTAL: 13  ICH score-2   Labs I have reviewed labs in epic and the results pertinent to this consultation  are: Leukocytosis WBC 12.1, hyperglycemia glucose 262, BUN 24, creatinine 1.1 CBC    Component Value Date/Time   WBC 12.1 (H) 09/19/2018 2215   RBC 3.64 (L) 10/11/2018 2215   HGB 11.2 (L) 10/03/2018 2227   HCT 33.0 (L) 09/24/2018 2227   PLT 304 09/29/2018 2215   MCV 88.2 09/23/2018 2215   MCH 28.6 09/26/2018 2215   MCHC 32.4 09/29/2018 2215   RDW 13.2 10/13/2018 2215   LYMPHSABS 2.1 10/08/2018 2215   MONOABS 0.5 10/01/2018 2215   EOSABS 0.2 10/16/2018 2215   BASOSABS 0.0 09/27/2018 2215    CMP     Component Value Date/Time   NA 135 09/22/2018 0009   K 3.5 09/22/2018 0009   CL 101 10/18/2018 2227   CO2 21 (L) 10/02/2018 2215   GLUCOSE 262 (H) 09/23/2018 2227   BUN 24 (H) 09/24/2018 2227   CREATININE 1.10 (H) 10/06/2018 2227   CALCIUM 9.0 10/05/2018 2215   PROT 7.1 09/24/2018 2215   ALBUMIN 4.1 09/30/2018 2215   AST 24 10/03/2018 2215   ALT 18 10/04/2018 2215   ALKPHOS 39 10/17/2018 2215   BILITOT 0.6 09/24/2018 2215   GFRNONAA 48 (L) 10/15/2018 2215   GFRAA 56 (L) 09/23/2018 2215  PT 12.1, INR 0.9, APTT 28.  Imaging I have reviewed the images obtained:  CT-scan of the brain- acute left thalamic bleed with caudal extension into the midbrain as well as extension into the ventricular system and subarachnoid spaces bilaterally.  Volume 10 cc with regional edema and mild regional mass-effect.  Short-term CT follow-up recommended within the next 3 hours.  CT angios head and neck-no emergent LVO.  No underlying vascular malformation other than mildly asymmetric attenuation of left PCA which could be from vasospasm or obscuration from the blood.  Repeat MRI/MRA recommended by radiology.  Chest x-ray IMPRESSION: 1. Endotracheal tube tip about 11 mm superior to carina 2. Low lung volumes with linear atelectasis or scarring at the bases. Slightly more patchy and focal opacity in the left infrahilar lung may reflect atelectasis, pneumonia or aspiration   Assessment: Mrs.  Sophia Peterson is a 68 year old woman with hypertension hyperlipidemia and diabetes presented to the emergency room for evaluation of fall as a level 2 trauma but noticed by ED provider/trauma surgeons to not moving the right side as much as left and a gaze deviation to the right. CT head with a thalamic bleed on the left with caudal extension to the midbrain on the left as well as extension into the ventricular system and bilateral subarachnoid spaces in the basal cisterns. No underlying vascular malformation noted on CT angios head/CT with contrast.  Patient not protecting her airway and had to be intubated emergently.  Most likely a hypertensive bleed given the location and the fact that her systolic blood pressures on arrival were high 200s.   Plan: Subcortical ICH, nontraumatic IVH, nontraumatic Subarachnoid extension of ICH  Acuity: Acute Laterality: Left thalamus Current suspected etiology: Hypertension Treatment: -Admit to neurological ICU -  ICH Score: 2 -ICH Volume: 10 cc -BP control goal SYS<140 -PT/OT/ST  -neuromonitoring  CNS Cerebral edema Compression of brain -One-time mannitol bolus -Close neuro monitoring  No evidence of hydrocephalus- repeat CT in 3 hours.  Consult neurosurgery if evidence of hydrocephalus.  Dysarthria Dysphagia following ICH  -NPO until cleared by speech -ST -May need PEG  Hemiplegia and hemiparesis following nontraumatic intracerebral hemorrhage affecting right dominant side   -Continue PT/OT/ST  RESP Acute Respiratory Failure  -vent management per ICU -wean when able  CV Hypertensive Emergency -Aggressive BP control, goal SBP <140 -Labetalol PRN followed by Cleviprex drip -Echo  GI/GU Elevated creatinine-likely AKI. -Gentle hydration -Repeat BMP in the morning  HEME Iron Deficiency Anemia -Monitor -transfuse for hgb < 7  No coagulopathy on labs, normal PT/INR APTT.  ENDO Type 2 diabetes mellitus with hyperglycemia   -SSI -goal HgbA1c < 7  Fluid/Electrolyte Disorders Repeat labs in the morning Replete electrolytes as necessary  ID Possible Aspiration PNA -CXR -NPO -Monitor -Appreciate PCCM assistance in management regarding decision on antibiotics  Nutrition E66.9 Obesity  E46 Protein-Calorie Malnutrition Mild Moderate Severe -diet consult  Prophylaxis DVT: SCDs GI: PPI Bowel: Docusate senna  Dispo: To be decided  Diet: NPO until cleared by speech  Code Status: Full Code   Had a detailed discussion in the hospital with son Mr. Sophia Peterson.  Explained the gravity of the situation and chances of meaningful recovery being guarded at this time.  High likelihood of mortality given the location, hypertension, age. Son verbalized understanding.   THE FOLLOWING WERE PRESENT ON ADMISSION: ICH, cerebral edema, acute respiratory failure, possible aspiration pneumonia, hypertensive emergency, hemiplegia  -- Amie Portland, MD [facility] Pager: [phone] If 7pm to 7am, please call on call as listed on AMION.   CRITICAL CARE ATTESTATION Performed by: Amie Portland, MD Total critical care time:70 minutes Critical care time was exclusive of separately billable procedures and treating other patients and/or supervising APPs/Residents/Students Critical care was necessary to treat or prevent imminent or life-threatening deterioration due to [place], hypertensive emergency This patient is critically ill and at significant risk for neurological worsening and/or death and care requires constant monitoring. Critical care was time spent personally by me on the following activities: development of treatment plan with patient and/or surrogate as well as nursing, discussions with consultants, evaluation of patient's response to treatment, examination of patient, obtaining history from patient or surrogate, ordering and performing treatments and interventions, ordering and review of laboratory  studies, ordering and review of radiographic studies, pulse oximetry, re-evaluation of patient's condition, participation in multidisciplinary rounds and medical decision making of high complexity in the care of this patient.

## 2018-09-21 NOTE — ED Provider Notes (Signed)
[facility] EMERGENCY DEPARTMENT Provider Note   CSN: [id number] Arrival date & time: 10/07/2018  2213    History   Chief Complaint Chief Complaint  Patient presents with   Altered Mental Status    HPI Sophia Peterson is a 68 y.o. female.     Level 5 caveat due to AMS.  Per EMS, family states that patient was outside had a syncopal episode.  Did not have any significant trauma.  Went inside and had decreased consciousness.  No signs of external trauma.  Unknown if patient is on a blood thinner.  Patient is a level trauma upon arrival however appears to be a code stroke.  Patient with vomitus in her mouth.  The history is provided by the EMS personnel.  Fall This is a new problem. The current episode started 1 to 2 hours ago. The problem occurs constantly. The problem has not changed since onset.Nothing aggravates the symptoms. Nothing relieves the symptoms. She has tried nothing for the symptoms. The treatment provided no relief.    No past medical history on file.  Patient Active Problem List   Diagnosis Date Noted   ICH (intracerebral hemorrhage) ([place]) 10/05/2018    OB History   No obstetric history on file.      Home Medications    Prior to Admission medications   Not on File    Family History No family history on file.  Social History Social History   Tobacco Use   Smoking status: Not on file  Substance Use Topics   Alcohol use: Not on file   Drug use: Not on file     Allergies   Patient has no allergy information on record.   Review of Systems Review of Systems  Unable to perform ROS: Mental status change     Physical Exam Updated Vital Signs  ED Triage Vitals  Enc Vitals Group     BP 10/08/2018 2210 (!) 152/78     Pulse Rate 09/29/2018 2210 96     Resp 09/26/2018 2210 16     Temp 09/22/2018 2210 (!) 96.7 F (35.9 C)     Temp Source 10/01/2018 2210 Tympanic     SpO2 09/23/2018 2210 96 %     Weight 09/23/2018 2215 132 lb 4.4  oz (60 kg)     Height --      Head Circumference --      Peak Flow --      Pain Score 10/03/2018 2210 0     Pain Loc --      Pain Edu? --      Excl. in [place]? --     Physical Exam Vitals signs and nursing note reviewed.  Constitutional:      General: She is in acute distress.     Appearance: She is well-developed. She is ill-appearing.  HENT:     Head: Normocephalic and atraumatic.     Nose: Nose normal.  Eyes:     Conjunctiva/sclera: Conjunctivae normal.     Pupils: Pupils are equal, round, and reactive to light.     Comments: Right sided gaze preference  Neck:     Musculoskeletal: Neck supple.  Cardiovascular:     Rate and Rhythm: Normal rate and regular rhythm.     Pulses: Normal pulses.     Heart sounds: Normal heart sounds. No murmur.  Pulmonary:     Effort: Pulmonary effort is normal. No respiratory distress.     Breath sounds: Normal breath sounds.  Abdominal:     Palpations: Abdomen is soft.     Tenderness: There is no abdominal tenderness.  Skin:    General: Skin is warm and dry.  Neurological:     Mental Status: She is alert.     GCS: GCS eye subscore is 4. GCS verbal subscore is 1. GCS motor subscore is 6.     Comments: Moves left sided extremities, withdraws to pain of right upper and right lower extremities, no speech      ED Treatments / Results  Labs (all labs ordered are listed, but only abnormal results are displayed) Labs Reviewed  CBC - Abnormal; Notable for the following components:      Result Value   WBC 12.1 (*)    RBC 3.64 (*)    Hemoglobin 10.4 (*)    HCT 32.1 (*)    All other components within normal limits  DIFFERENTIAL - Abnormal; Notable for the following components:   Neutro Abs 9.1 (*)    All other components within normal limits  COMPREHENSIVE METABOLIC PANEL - Abnormal; Notable for the following components:   Sodium 133 (*)    CO2 21 (*)    Glucose, Bld 260 (*)    BUN 25 (*)    Creatinine, Ser 1.18 (*)    GFR calc non Af Amer  48 (*)    GFR calc Af Amer 56 (*)    All other components within normal limits  CBG MONITORING, ED - Abnormal; Notable for the following components:   Glucose-Capillary 257 (*)    All other components within normal limits  I-STAT CHEM 8, ED - Abnormal; Notable for the following components:   BUN 24 (*)    Creatinine, Ser 1.10 (*)    Glucose, Bld 262 (*)    Calcium, Ion 1.09 (*)    Hemoglobin 11.2 (*)    HCT 33.0 (*)    All other components within normal limits  SARS CORONAVIRUS 2 (HOSPITAL ORDER, [place] [facility])  ETHANOL  PROTIME-INR  APTT  RAPID URINE DRUG SCREEN, HOSP PERFORMED  URINALYSIS, ROUTINE W REFLEX MICROSCOPIC  HIV ANTIBODY (ROUTINE TESTING W REFLEX)  TYPE AND SCREEN  PREPARE FRESH FROZEN PLASMA  ABO/RH    EKG EKG Interpretation  Date/Time:  Saturday September 21 2018 22:31:58 EDT Ventricular Rate:  82 PR Interval:    QRS Duration: 105 QT Interval:  425 QTC Calculation: 497 R Axis:   86 Text Interpretation:  Sinus rhythm Borderline right axis deviation Borderline prolonged QT interval No old tracing to compare Confirmed by Delora Fuel ([id number]) on 09/24/2018 11:19:56 PM   Radiology Dg Pelvis Portable  Result Date: 09/20/2018 CLINICAL DATA:  Trauma EXAM: PORTABLE PELVIS 1-2 VIEWS COMPARISON:  None. FINDINGS: There is no evidence of pelvic fracture or diastasis. No pelvic bone lesions are seen. IMPRESSION: Negative. Electronically Signed   By: Donavan Foil M.D.   On: 09/20/2018 22:59   Dg Chest Portable 1 View  Result Date: 10/17/2018 CLINICAL DATA:  Intubated EXAM: PORTABLE CHEST 1 VIEW COMPARISON:  None. FINDINGS: Endotracheal tube tip is about 11 mm superior to the carina. Esophageal tube tip below the diaphragm but incompletely visualized. Low lung volumes with streaky bibasilar atelectasis or scar. Patchy more focal opacity in the left infrahilar lung. No pleural effusion. Mild cardiomegaly. No pneumothorax. IMPRESSION: 1. Endotracheal tube  tip about 11 mm superior to carina 2. Low lung volumes with linear atelectasis or scarring at the bases. Slightly more patchy and focal  opacity in the left infrahilar lung may reflect atelectasis, pneumonia or aspiration Electronically Signed   By: Donavan Foil M.D.   On: 09/26/2018 22:59   Ct Head Code Stroke Wo Contrast  Result Date: 10/08/2018 CLINICAL DATA:  Code stroke. 68 year old female with altered mental status, intubated. EXAM: CT HEAD WITHOUT CONTRAST TECHNIQUE: Contiguous axial images were obtained from the base of the skull through the vertex without intravenous contrast. COMPARISON:  None. FINDINGS: Brain: Mild motion artifact. Hyperdense acute intra-axial hemorrhage centered at the left thalamus with extension into the ventricular system and also into the subarachnoid system, ambient cisterns left greater than right (series 3, image 21). There is also caudal extension of hemorrhage into the left midbrain. Estimated intra-axial hemorrhage is 23 x 29 by 29 millimeters (AP by transverse by CC), estimated volume 10 milliliters. There is regional edema. There is regional mass effect including mild rightward midline shift at the posterior 3rd ventricle. The suprasellar cistern remains patent. There is a small volume of blood in the pre medullary and prepontine cisterns. No definite ventriculomegaly at this time. No subdural blood suspected. Outside of the area of hemorrhage and edema gray-white matter differentiation appears preserved. Vascular: No suspicious intracranial vascular hyperdensity. Skull: No acute osseous abnormality identified. Sinuses/Orbits: Mild motion artifact, paranasal sinuses and mastoids are well pneumatized. Other: Intubated on the scout view. No acute orbit or scalp soft tissue finding. ASPECTS [facility] Stroke Program Early CT Score) Not applicable, acute hemorrhage. IMPRESSION: 1. Acute intra-axial hemorrhage centered at the left thalamus with caudal extension into the midbrain,  as well as extension into the ventricular system AND subarachnoid spaces. 2. Estimated intra-axial blood volume 10 mL. Regional edema and mild regional mass effect. 3. Study discussed by telephone with Dr. Rory Percy at 10:56 pm on 09/22/2018. Electronically Signed   By: Genevie Ann M.D.   On: 10/15/2018 22:58    Procedures .Critical Care Performed by: Lennice Sites, DO Authorized by: Lennice Sites, DO   Critical care provider statement:    Critical care time (minutes):  32   Critical care was necessary to treat or prevent imminent or life-threatening deterioration of the following conditions:  CNS failure or compromise   Critical care was time spent personally by me on the following activities:  Blood draw for specimens, development of treatment plan with patient or surrogate, discussions with primary provider, evaluation of patient's response to treatment, examination of patient, obtaining history from patient or surrogate, ordering and performing treatments and interventions, ordering and review of laboratory studies, ordering and review of radiographic studies, pulse oximetry, re-evaluation of patient's condition and review of old charts   I assumed direction of critical care for this patient from another provider in my specialty: no     (including critical care time)  Medications Ordered in ED Medications   stroke: mapping our early stages of recovery book (has no administration in time range)  acetaminophen (TYLENOL) tablet 650 mg (has no administration in time range)    Or  acetaminophen (TYLENOL) solution 650 mg (has no administration in time range)    Or  acetaminophen (TYLENOL) suppository 650 mg (has no administration in time range)  senna-docusate (Senokot-S) tablet 1 tablet (has no administration in time range)  pantoprazole (PROTONIX) injection 40 mg (has no administration in time range)  labetalol (NORMODYNE) injection 20 mg (has no administration in time range)    And  clevidipine  (CLEVIPREX) infusion 0.5 mg/mL (has no administration in time range)  mannitol 20 % infusion 50  g (has no administration in time range)  midazolam (VERSED) injection 4 mg (has no administration in time range)  midazolam (VERSED) 2 MG/2ML injection (5 mg  Given 10/18/2018 2232)  fentaNYL (SUBLIMAZE) 100 MCG/2ML injection (50 mcg  Given 10/13/2018 2301)  iohexol (OMNIPAQUE) 350 MG/ML injection 100 mL (100 mLs Intravenous Contrast Given 10/14/2018 2307)     Initial Impression / Assessment and Plan / ED Course  I have reviewed the triage vital signs and the nursing notes.  Pertinent labs & imaging results that were available during my care of the patient were reviewed by me and considered in my medical decision making (see chart for details).     Sophia Peterson is a 68 year old female with history of hypertension, high cholesterol who presents to the ED with altered mental status.  Patient originally was a level 2 trauma as she had possibly a ground-level fall however was upgraded to a code stroke upon arrival.  EMS states that patient was outside and had a syncopal episode.  She came back inside and slowly had decreased loss of consciousness.  Unknown if she is on a blood thinner.  Patient with large amount of vomit in her mouth just prior to arrival.  Patient has right-sided gaze.  She appears to follow commands but is unable to move her right side, she is not protecting her airway.  Patient was subsequently intubated by my resident with etomidate and succs with a grade 1 view.  Patient was started on a propofol drip. No signs of external head trauma.  She was given intermittent Versed and fentanyl as well for sedation.  Patient normotensive following intubation and propofol.  Dr. Malen Gauze with neurology was at the bedside and we went to the CT scan which showed a thalamic bleed.  Overall suspicion is for hypertensive head bleed.  Blood pressures well controlled at this time.  There is significant extension of this  bleeding to the midbrain and ventricular spaces and subarachnoid spaces.  However, does not appear to be a neurosurgical candidate.  Patient otherwise with negative coronavirus test.  Remained hemodynamically stable and intubated in the ED.  Patient admitted to the neurological ICU for further care. CT scan of neck is pending. Patient in [place]. Patient with unremarkable cxr and pelvic xray. No concern for other significant trauma.   This chart was dictated using voice recognition software.  Despite best efforts to proofread,  errors can occur which can change the documentation meaning.    Final Clinical Impressions(s) / ED Diagnoses   Final diagnoses:  Cerebrovascular accident (CVA), unspecified mechanism [facility])    ED Discharge Orders    None       Lennice Sites, DO 09/22/2018 2356

## 2018-09-21 NOTE — ED Notes (Signed)
On arrival pt noted to have R sided gaze, large amount of vomit noted.

## 2018-09-22 ENCOUNTER — Inpatient Hospital Stay (HOSPITAL_COMMUNITY): Payer: Medicare HMO

## 2018-09-22 ENCOUNTER — Inpatient Hospital Stay: Payer: Self-pay

## 2018-09-22 DIAGNOSIS — I629 Nontraumatic intracranial hemorrhage, unspecified: Secondary | ICD-10-CM

## 2018-09-22 DIAGNOSIS — I619 Nontraumatic intracerebral hemorrhage, unspecified: Secondary | ICD-10-CM

## 2018-09-22 LAB — BPAM FFP
Blood Product Expiration Date: 202007072359
Blood Product Expiration Date: 202007072359
ISSUE DATE / TIME: 202007042238
ISSUE DATE / TIME: 202007042238
Unit Type and Rh: 2800
Unit Type and Rh: 8400

## 2018-09-22 LAB — URINALYSIS, ROUTINE W REFLEX MICROSCOPIC
Bilirubin Urine: NEGATIVE
Glucose, UA: 150 mg/dL — AB
Hgb urine dipstick: NEGATIVE
Ketones, ur: NEGATIVE mg/dL
Leukocytes,Ua: NEGATIVE
Nitrite: NEGATIVE
Protein, ur: NEGATIVE mg/dL
Specific Gravity, Urine: 1.034 — ABNORMAL HIGH (ref 1.005–1.030)
pH: 6 (ref 5.0–8.0)

## 2018-09-22 LAB — GLUCOSE, CAPILLARY
Glucose-Capillary: 188 mg/dL — ABNORMAL HIGH (ref 70–99)
Glucose-Capillary: 213 mg/dL — ABNORMAL HIGH (ref 70–99)
Glucose-Capillary: 162 mg/dL — ABNORMAL HIGH (ref 70–99)
Glucose-Capillary: 138 mg/dL — ABNORMAL HIGH (ref 70–99)
Glucose-Capillary: 155 mg/dL — ABNORMAL HIGH (ref 70–99)

## 2018-09-22 LAB — BPAM RBC
Blood Product Expiration Date: 202007082359
Blood Product Expiration Date: 202007092359
ISSUE DATE / TIME: 202007042238
ISSUE DATE / TIME: 202007042238
Unit Type and Rh: 9500
Unit Type and Rh: 9500

## 2018-09-22 LAB — CBC WITH DIFFERENTIAL/PLATELET
Abs Immature Granulocytes: 0.05 10*3/uL (ref 0.00–0.07)
Basophils Absolute: 0 10*3/uL (ref 0.0–0.1)
Basophils Relative: 0 %
Eosinophils Absolute: 0 10*3/uL (ref 0.0–0.5)
Eosinophils Relative: 0 %
HCT: 29.7 % — ABNORMAL LOW (ref 36.0–46.0)
Hemoglobin: 9.8 g/dL — ABNORMAL LOW (ref 12.0–15.0)
Immature Granulocytes: 0 %
Lymphocytes Relative: 8 %
Lymphs Abs: 1 10*3/uL (ref 0.7–4.0)
MCH: 28.7 pg (ref 26.0–34.0)
MCHC: 33 g/dL (ref 30.0–36.0)
MCV: 87.1 fL (ref 80.0–100.0)
Monocytes Absolute: 0.5 10*3/uL (ref 0.1–1.0)
Monocytes Relative: 4 %
Neutro Abs: 10 10*3/uL — ABNORMAL HIGH (ref 1.7–7.7)
Neutrophils Relative %: 88 %
Platelets: 236 10*3/uL (ref 150–400)
RBC: 3.41 MIL/uL — ABNORMAL LOW (ref 3.87–5.11)
RDW: 13.3 % (ref 11.5–15.5)
WBC: 11.6 10*3/uL — ABNORMAL HIGH (ref 4.0–10.5)
nRBC: 0 % (ref 0.0–0.2)

## 2018-09-22 LAB — SODIUM
Sodium: 137 mmol/L (ref 135–145)
Sodium: 140 mmol/L (ref 135–145)

## 2018-09-22 LAB — RAPID URINE DRUG SCREEN, HOSP PERFORMED
Amphetamines: NOT DETECTED
Barbiturates: NOT DETECTED
Benzodiazepines: POSITIVE — AB
Cocaine: NOT DETECTED
Opiates: NOT DETECTED
Tetrahydrocannabinol: NOT DETECTED

## 2018-09-22 LAB — PREPARE FRESH FROZEN PLASMA
Unit division: 0
Unit division: 0

## 2018-09-22 LAB — TYPE AND SCREEN
ABO/RH(D): O POS
Antibody Screen: NEGATIVE
Unit division: 0
Unit division: 0

## 2018-09-22 LAB — MRSA PCR SCREENING: MRSA by PCR: NEGATIVE

## 2018-09-22 LAB — BASIC METABOLIC PANEL
Anion gap: 13 (ref 5–15)
BUN: 21 mg/dL (ref 8–23)
CO2: 23 mmol/L (ref 22–32)
Calcium: 9.1 mg/dL (ref 8.9–10.3)
Chloride: 98 mmol/L (ref 98–111)
Creatinine, Ser: 1.08 mg/dL — ABNORMAL HIGH (ref 0.44–1.00)
GFR calc Af Amer: >60 mL/min (ref >60)
GFR calc non Af Amer: 53 mL/min — ABNORMAL LOW (ref >60)
Glucose, Bld: 219 mg/dL — ABNORMAL HIGH (ref 70–99)
Potassium: 4.2 mmol/L (ref 3.5–5.1)
Sodium: 134 mmol/L — ABNORMAL LOW (ref 135–145)

## 2018-09-22 LAB — POCT I-STAT 7, (LYTES, BLD GAS, ICA,H+H)
Acid-base deficit: 1 mmol/L (ref 0.0–2.0)
Bicarbonate: 23.2 mmol/L (ref 20.0–28.0)
Calcium, Ion: 1.2 mmol/L (ref 1.15–1.40)
HCT: 30 % — ABNORMAL LOW (ref 36.0–46.0)
Hemoglobin: 10.2 g/dL — ABNORMAL LOW (ref 12.0–15.0)
O2 Saturation: 99 %
Patient temperature: 97.6
Potassium: 3.5 mmol/L (ref 3.5–5.1)
Sodium: 135 mmol/L (ref 135–145)
TCO2: 24 mmol/L (ref 22–32)
pCO2 arterial: 34 mmHg (ref 32.0–48.0)
pH, Arterial: 7.439 (ref 7.350–7.450)
pO2, Arterial: 126 mmHg — ABNORMAL HIGH (ref 83.0–108.0)

## 2018-09-22 LAB — HEMOGLOBIN A1C
Hgb A1c MFr Bld: 9.3 % — ABNORMAL HIGH (ref 4.8–5.6)
Mean Plasma Glucose: 220.21 mg/dL

## 2018-09-22 LAB — HIV ANTIBODY (ROUTINE TESTING W REFLEX): HIV Screen 4th Generation wRfx: NONREACTIVE

## 2018-09-22 LAB — TRIGLYCERIDES: Triglycerides: 100 mg/dL (ref <150)

## 2018-09-22 LAB — ABO/RH: ABO/RH(D): O POS

## 2018-09-22 LAB — BLOOD PRODUCT ORDER (VERBAL) VERIFICATION

## 2018-09-22 MED ORDER — LABETALOL HCL 5 MG/ML IV SOLN
20.0000 mg | INTRAVENOUS | Status: DC | PRN
  Administered 2018-09-22: 16:00:00 20 mg via INTRAVENOUS
  Filled 2018-09-22: qty 4

## 2018-09-22 MED ORDER — INSULIN ASPART 100 UNIT/ML ~~LOC~~ SOLN: 0.0000 [IU] | Freq: Three times a day (TID) | SUBCUTANEOUS | Status: DC

## 2018-09-22 MED ORDER — FENTANYL CITRATE (PF) 100 MCG/2ML IJ SOLN
50.0000 ug | INTRAMUSCULAR | Status: DC | PRN
  Filled 2018-09-22 (×2): qty 2

## 2018-09-22 MED ORDER — CHLORHEXIDINE GLUCONATE CLOTH 2 % EX PADS
6.0000 | MEDICATED_PAD | Freq: Every day | CUTANEOUS | Status: DC
  Administered 2018-09-22 – 2018-09-24 (×3): 6 via TOPICAL

## 2018-09-22 MED ORDER — FAMOTIDINE IN NACL 20-0.9 MG/50ML-% IV SOLN
20.0000 mg | Freq: Two times a day (BID) | INTRAVENOUS | Status: DC
  Administered 2018-09-22 – 2018-09-23 (×4): 20 mg via INTRAVENOUS
  Filled 2018-09-22 (×4): qty 50

## 2018-09-22 MED ORDER — ALBUTEROL SULFATE (2.5 MG/3ML) 0.083% IN NEBU: 2.5000 mg | INHALATION_SOLUTION | RESPIRATORY_TRACT | Status: DC

## 2018-09-22 MED ORDER — SENNOSIDES-DOCUSATE SODIUM 8.6-50 MG PO TABS
1.0000 | ORAL_TABLET | Freq: Two times a day (BID) | ORAL | Status: DC
  Administered 2018-09-22 – 2018-09-23 (×4): 1
  Filled 2018-09-22 (×4): qty 1

## 2018-09-22 MED ORDER — ALTEPLASE 2 MG IJ SOLR
2.0000 mg | Freq: Once | INTRAMUSCULAR | Status: AC
  Administered 2018-09-22: 2 mg
  Filled 2018-09-22: qty 2

## 2018-09-22 MED ORDER — PROPOFOL 1000 MG/100ML IV EMUL
5.0000 ug/kg/min | INTRAVENOUS | Status: DC
  Administered 2018-09-22: 20 ug/kg/min via INTRAVENOUS
  Filled 2018-09-22: qty 100

## 2018-09-22 MED ORDER — PROPOFOL 1000 MG/100ML IV EMUL
INTRAVENOUS | Status: AC | PRN
  Administered 2018-09-21: 900 ug via INTRAVENOUS

## 2018-09-22 MED ORDER — SUCCINYLCHOLINE CHLORIDE 20 MG/ML IJ SOLN
INTRAMUSCULAR | Status: AC | PRN
  Administered 2018-09-21: 80 mg via INTRAVENOUS

## 2018-09-22 MED ORDER — SODIUM CHLORIDE 3 % IV SOLN
INTRAVENOUS | Status: AC
  Administered 2018-09-22 – 2018-09-23 (×4): 75 mL/h via INTRAVENOUS
  Filled 2018-09-22 (×5): qty 500

## 2018-09-22 MED ORDER — ALBUTEROL SULFATE (2.5 MG/3ML) 0.083% IN NEBU: 2.5000 mg | INHALATION_SOLUTION | RESPIRATORY_TRACT | Status: DC | PRN

## 2018-09-22 MED ORDER — INSULIN ASPART 100 UNIT/ML ~~LOC~~ SOLN
0.0000 [IU] | SUBCUTANEOUS | Status: DC
  Administered 2018-09-22: 2 [IU] via SUBCUTANEOUS
  Administered 2018-09-22 (×2): 3 [IU] via SUBCUTANEOUS
  Administered 2018-09-22: 5 [IU] via SUBCUTANEOUS
  Administered 2018-09-22: 3 [IU] via SUBCUTANEOUS
  Administered 2018-09-23: 2 [IU] via SUBCUTANEOUS
  Administered 2018-09-23: 20:00:00 3 [IU] via SUBCUTANEOUS
  Administered 2018-09-23 (×2): 2 [IU] via SUBCUTANEOUS
  Administered 2018-09-23 (×2): 3 [IU] via SUBCUTANEOUS
  Administered 2018-09-24: 5 [IU] via SUBCUTANEOUS
  Administered 2018-09-24: 3 [IU] via SUBCUTANEOUS
  Administered 2018-09-24 (×2): 5 [IU] via SUBCUTANEOUS

## 2018-09-22 MED ORDER — ETOMIDATE 2 MG/ML IV SOLN
INTRAVENOUS | Status: AC | PRN
  Administered 2018-09-21: 15 mg via INTRAVENOUS

## 2018-09-22 MED ORDER — CHLORHEXIDINE GLUCONATE 0.12% ORAL RINSE (MEDLINE KIT)
15.0000 mL | Freq: Two times a day (BID) | OROMUCOSAL | Status: DC
  Administered 2018-09-22 – 2018-09-24 (×5): 15 mL via OROMUCOSAL

## 2018-09-22 MED ORDER — HYDRALAZINE HCL 20 MG/ML IJ SOLN: 10.0000 mg | Freq: Four times a day (QID) | INTRAMUSCULAR | Status: DC | PRN

## 2018-09-22 MED ORDER — ORAL CARE MOUTH RINSE
15.0000 mL | OROMUCOSAL | Status: DC
  Administered 2018-09-22 – 2018-09-25 (×29): 15 mL via OROMUCOSAL

## 2018-09-22 NOTE — Progress Notes (Signed)
STROKE TEAM PROGRESS NOTE   HISTORY OF PRESENT ILLNESS (per Dr Rory Percy) Sophia Peterson is a 68 y.o. female past medical history of hypertension, diabetes, and hyperlipidemia, in her usual state of health till about 9:15 PM when she went into her garden and had a sudden onset of collapse. She was brought into the hospital via [facility] as a level 2 trauma because of having sustained a fall but on initial evaluation by the ED provider and trauma doctors, she is not moving the right side of her body as much as the left and had a gaze to the right for which a code stroke was activated after discussing with me. Of note, she had also had a large amount of vomitus upon arrival to the ER. I saw and evaluated the patient in the trauma room-my exam as listed below. I was able to speak with the son Mr. Sophia Peterson, who was present in the waiting room and provided some history.  She lives with her husband who is elderly.  2 sons check up on her-Mr. Sophia Peterson and Mr. Sophia Peterson. Son's phone numbers: Donzetta Starch Son's phone number: Sophia Peterson- [phone]   LKW: 9:15 PM on September 21, 2018 tpa given?: no, ICH Premorbid modified Rankin scale (mRS): 0 ICH score-2   SUBJECTIVE (INTERVAL HISTORY) Her RN is at the bedside.  She had ventriculostomy early this morning which is draining well but patient mental status has not improved.  She has been off sedation.  She remains unresponsive with left gaze deviation and dense right hemiplegia.  There is spontaneous semipurposeful movements on the left.  Blood pressure is adequately controlled.  She is on ventilator for respiratory failure.  CT angiogram shows no aneurysm or AVM.  Post ventriculostomy CT scan is pending this morning   OBJECTIVE Vitals:   09/22/18 0700 09/22/18 0750 09/22/18 0800 09/22/18 0815  BP: 108/61 108/61 (!) 145/74 134/77  Pulse:  83 69   Resp: 16 18 19    Temp:      TempSrc:      SpO2:  100% 100%   Weight:      Height:  5\' 2"   (1.575 m)      CBC:  Recent Labs  Lab 09/29/2018 2215  09/22/18 0009 09/22/18 0254  WBC 12.1*  --   --  11.6*  NEUTROABS 9.1*  --   --  10.0*  HGB 10.4*   < > 10.2* 9.8*  HCT 32.1*   < > 30.0* 29.7*  MCV 88.2  --   --  87.1  PLT 304  --   --  236   < > = values in this interval not displayed.    Basic Metabolic Panel:  Recent Labs  Lab 09/18/2018 2215 10/15/2018 2227 09/22/18 0009 09/22/18 0254  NA 133* 135 135 134*  K 3.5 3.6 3.5 4.2  CL 100 101  --  98  CO2 21*  --   --  23  GLUCOSE 260* 262*  --  219*  BUN 25* 24*  --  21  CREATININE 1.18* 1.10*  --  1.08*  CALCIUM 9.0  --   --  9.1    Lipid Panel:     Component Value Date/Time   TRIG 100 09/22/2018 0254   HgbA1c:  Lab Results  Component Value Date   HGBA1C 9.3 (H) 09/23/2018   Urine Drug Screen:     Component Value Date/Time   LABOPIA NONE DETECTED 09/22/2018 0212   COCAINSCRNUR NONE  DETECTED 09/22/2018 0212   LABBENZ POSITIVE (A) 09/22/2018 0212   AMPHETMU NONE DETECTED 09/22/2018 0212   THCU NONE DETECTED 09/22/2018 0212   LABBARB NONE DETECTED 09/22/2018 0212    Alcohol Level     Component Value Date/Time   ETH <10 10/05/2018 2215    IMAGING  Ct Angio Head W Or Wo Contrast Ct Angio Neck W Or Wo Contrast 10/02/2018 IMPRESSION:  1. Patent carotid and vertebral arteries. No dissection, aneurysm, or hemodynamically significant stenosis utilizing NASCET criteria.  2. Patent anterior and posterior intracranial circulation. No large vessel occlusion, aneurysm, or vascular malformation.  3. Mild asymmetric attenuation of left PCA, possibly early vasospasm.  4. Acute hemorrhage within left thalamus extending into brainstem as well as the ventricular system and basilar subarachnoid space is grossly stable from prior CT head.   Ct Head Wo Contrast 09/22/2018 IMPRESSION:  1. Interval increased size of both lateral and third ventricles compatible with developing obstructive hydrocephalus.  2. Stable acute  hemorrhage centered within left thalamus extending into left midbrain and upper pons. Stable extra-axial hemorrhage in fourth ventricle and posterior fossa cisterns.  Ct Cervical Spine Wo Contrast 10/05/2018 IMPRESSION:  1. No acute fracture or subluxation of the cervical spine.  2. Multilevel degenerative disc disease, most prominent at C5-C6.   Dg Pelvis Portable 10/11/2018 IMPRESSION:  Negative.   Dg Chest Portable 1 View 10/17/2018 IMPRESSION:  1. Endotracheal tube tip about 11 mm superior to carina  2. Low lung volumes with linear atelectasis or scarring at the bases. Slightly more patchy and focal opacity in the left infrahilar lung may reflect atelectasis, pneumonia or aspiration   Ct Head Code Stroke Wo Contrast  10/07/2018 IMPRESSION:  1. Acute intra-axial hemorrhage centered at the left thalamus with caudal extension into the midbrain, as well as extension into the ventricular system AND subarachnoid spaces.  2. Estimated intra-axial blood volume 10 mL. Regional edema and mild regional mass effect.    EKG - SR rate 82 BPM. (See cardiology reading for complete details)   PHYSICAL EXAM Blood pressure 134/77, pulse 69, temperature 98.1 F (36.7 C), temperature source Oral, resp. rate 19, height 5\' 2"  (1.575 m), weight 72.9 kg, SpO2 100 %. Frail middle-aged lady who is intubated and not sedated. . Afebrile. Head is nontraumatic. Neck is supple without bruit.    Cardiac exam no murmur or gallop. Lungs are clear to auscultation. Distal pulses are well felt. Neurological Exam :  Patient is intubated.  Not on sedation.  Eyes are closed.  Opens eyes partially to sternal rub.  She does not follow any commands.  Left gaze deviation but there is some spontaneous movement to the midline.  Pupils are 4 mm sluggishly reactive.  Corneal reflexes are present.  She does have cough and gag.  Motor system exam shows spontaneous left upper and lower extremity movement.  She does withdraw the right  lower extremity partially to painful stimuli.  Right upper extremity is flaccid with no movements.    ASSESSMENT/PLAN [name] is a 68 y.o. female with history of hypertension, diabetes, and hyperlipidemia presenting with collapse, right sided weakness, right gaze preference and vomiting. She did not receive IV t-PA due to [place].  ICH - Acute hemorrhage within left thalamus extending into brainstem as well as the ventricular system and basilar subarachnoid space. Estimated intra-axial blood volume 10 mL.   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  CT Head 7/5 - Interval increased size of both lateral and third ventricles compatible with developing obstructive hydrocephalus.   MRI head - not performed  MRA head - not performed  CTA H&N - No large vessel occlusion, aneurysm, or vascular malformation. Mild asymmetric attenuation of left PCA, possibly early vasospasm.   Carotid Doppler - CTA neck performed - carotid dopplers not indicated.  2D Echo - not indicated  [employer] Virus 2  - negative  LDL - pending  HgbA1c - 9.3  UDS - benzodiazepines  VTE prophylaxis - SCDs  Diet - NPO  No antithrombotic prior to admission, now on No antithrombotic  Ongoing aggressive stroke risk factor management  Therapy recommendations:  pending  Disposition:  Pending  Hypertension  Stable . Systolic blood pressure goal < 160 mmHg . Long-term BP goal normotensive  Hyperlipidemia  Lipid lowering medication PTA:  none  LDL pending , goal < 70  Current lipid lowering medication: none  Continue statin at discharge  Diabetes  HgbA1c 9.3, goal < 7.0  Uncontrolled  Other Stroke Risk Factors  Advanced age  Cigarette smoker - not on file  ETOH - not on file  Obesity,  Body mass index is 29.4 kg/m., recommend weight loss, diet and exercise as appropriate   Family hx stroke - not on file   Other Active Problems  Anemia - Hb - 10.4->10.2->9.8  Creatinine - 1.18->1.10->1.08  Na - 133->135->134  Mild leukocytosis -  12.1->11.6 (afebrile)     Hospital day # 1  I have personally obtained history,examined this patient, reviewed notes, independently viewed imaging studies, participated in medical decision making and plan of care.ROS completed by me personally and pertinent positives fully documented  I have made any additions or clarifications directly to the above note.  She has presented with unresponsive state with right hemiplegia due to left thalamic and brainstem hemorrhage with hydrocephalus requiring emergent intubation and ventriculostomy however neurological exam remains quite poor.  Recommend strict control of blood pressure with systolic blood pressure goal below 140 for the first 24 hours and then below 863 systolic.  Keep eye equals oh, normothermic and euglycemic.  Continue ventriculostomy drainage as per neurosurgery and ventilator management as per pulmonary critical care team.  Add PRN IV labetalol and hydralazine for blood pressure control.  Continue GI prophylaxis and DVT prophylaxis.  I had long discussion with the patient's son over the phone about her condition and answered questions.  Discussed with Dr. Elsworth Soho and Dr. Venetia Constable This patient is critically ill and at significant risk of neurological worsening, hydrocephalus, vasospasm death and care requires constant monitoring of vital signs, hemodynamics,respiratory and cardiac monitoring, extensive review of multiple databases, frequent neurological assessment, discussion with family, other specialists and medical decision making of high complexity.I have made any additions or clarifications directly to the above note.This critical care time does not reflect procedure time, or teaching time or  supervisory time of PA/NP/Med Resident etc but could involve care discussion time.  I spent 40 minutes of neurocritical care time  in the care of  this patient.     Sophia Contras, MD Medical Director [facility] Pager: [phone] 09/22/2018 10:36 AM   To contact Stroke Continuity provider, please refer to [email or web address] After hours, contact General Neurology

## 2018-09-22 NOTE — Progress Notes (Signed)
Unable to complete video call with son, Mortimer Fries

## 2018-09-22 NOTE — ED Notes (Signed)
CCM at bedside 

## 2018-09-22 NOTE — Progress Notes (Signed)
Transported pt down to CT and back to 5H29 without complication.

## 2018-09-22 NOTE — Progress Notes (Signed)
Spoke with Dr. Zada Finders. He will place EVD. Son informed. Repeat CTH at 0900.  -- Amie Portland, MD [facility] Pager: [phone] If 7pm to 7am, please call on call as listed on AMION.

## 2018-09-22 NOTE — Progress Notes (Signed)
OT Cancellation Note  Patient Details Name: Sophia Peterson MRN: [id number] DOB: 1951-02-07   Cancelled Treatment:    Reason Eval/Treat Not Completed: Active bedrest order(Will return as schedule allows. Thank you.)  [place], OTR/L Acute Rehab Pager: [phone] Office: [phone] 09/22/2018, 6:46 AM

## 2018-09-22 NOTE — Procedures (Signed)
PREOP DX: Hydrocephalus  POSTOP DX: Same  PROCEDURE: Right frontal ventriculostomy   SURGEON: Dr. Emelda Brothers  ANESTHESIA: IV Sedation (fentanyl) with Local  EBL: Minimal  SPECIMENS: None  COMPLICATIONS: None  CONDITION: Hemodynamically stable  INDICATIONS: Mrs. Sophia Peterson is a 68 y.o. 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  PROCEDURE IN DETAIL: After consent was obtained from the patient's family, skin of the right frontal scalp was clipped, prepped and draped in the usual sterile fashion.  Scalp was then infiltrated with local anesthetic with epinephrine.  Skin incision was made sharply, and twist drill burr hole was made.  The dura was then incised, and the ventricular catheter was passed in one attempt into the right lateral ventricle.  Good CSF flow was obtained.  The catheter was then tunneled subcutaneously and connected to a drainage system and the skin incision closed.  The drain was then secured in place.  FINDINGS: 1. Opening pressure >15cm H2O 2. Blood-tinged CSF

## 2018-09-22 NOTE — Progress Notes (Signed)
[place] with hydrocephalus. Requested NSGY consultation for possible EVD - will update recs after discussion with NSGY.  -- Amie Portland, MD [facility] Pager: [phone] If 7pm to 7am, please call on call as listed on AMION.

## 2018-09-22 NOTE — Progress Notes (Addendum)
eLink Physician-Brief Progress Note Patient Name: Sophia Peterson DOB: 1950-08-29 MRN: [id number]   Date of Service  09/22/2018  HPI/Events of Note  A 68 year old female evaluated by the neurologist for hypertensive bleed in the left thalamus region.   eICU Interventions  Critical care was involved for ventilator and blood pressure management.  Neurologist recommended to maintain systolic blood pressure less than 140.  On clevidipine drip.  Notified Dr. Mariane Masters.     Intervention Category Major Interventions: Intracranial hypertension - evaluation and management Intermediate Interventions: Bleeding - evaluation and treatment with blood products;Communication with other healthcare providers and/or family Evaluation Type: New Patient Evaluation  [name] 09/22/2018, 12:10 AM   1:49 AM Bladder scan shows a postvoid residual more than 500 cc.  Ordered Foley's catheter as per bedside request.

## 2018-09-22 NOTE — Progress Notes (Signed)
Consent obtained via telephone with Trudee Grip. Unable to place PICC today. Patient has two functioning PIVs. Amy, RN informed of consent and PICC will be placed in am.

## 2018-09-22 NOTE — Progress Notes (Signed)
Hypertonic saline at 75 mL/hr started in the R FA PIV at 1429

## 2018-09-22 NOTE — ED Triage Notes (Signed)
Pt arrived via [employer], pt reported to have fallen in garden @ 2115, pt called husband then ambulated into her home, pt became unresponsive @ 20 min later. Pt arrived with large amount of vomit on face/neck, R sided gaze. No visible trauma, ccollar in place. #18 L AC.

## 2018-09-22 NOTE — Progress Notes (Signed)
Pulled 100 mcg of fentanyl from pyxis but never given, wasted in sharps with Leverne Humbles RN.

## 2018-09-22 NOTE — Progress Notes (Signed)
Changed tidal volume to 8cc per Md order.

## 2018-09-22 NOTE — ED Notes (Signed)
Dr. Rory Percy in room assessing patient

## 2018-09-22 NOTE — Consult Note (Signed)
NAME:  Sophia Peterson, MRN:  [id number], DOB:  1950/11/15, LOS: 1 ADMISSION DATE:  10/18/2018, CONSULTATION DATE:  10/01/2018 REFERRING MD:  Ronnald Nian, CHIEF COMPLAINT:  Altered mental status  Brief History   68 yo with intracranial hemorrhage   History of present illness   Patient is a 68 yo female with history HTN who fell in her garden, went inside with altered mental status.  On arrival to ER not moving the R side with right deviated gaze.  She vomited on arrival to ER.  Little other history available.  History of HTN, type 2 DM.    Past Medical History  As above  Significant Hospital Events   Admission, intubation 09/20/2018  Consults:  NA  Procedures:  NA  Significant Diagnostic Tests:  NA  Micro Data:  NA  Antimicrobials:  NA  Interim history/subjective:  NA  Objective   Blood pressure 109/68, pulse 82, temperature (!) 96.7 F (35.9 C), temperature source Tympanic, resp. rate 16, weight 60 kg, SpO2 100 %.    Vent Mode: PRVC FiO2 (%):  [60 %] 60 % Set Rate:  [16 bmp] 16 bmp Vt Set:  [450 mL] 450 mL PEEP:  [5 cmH20] 5 cmH20 Plateau Pressure:  [16 cmH20-17 cmH20] 16 cmH20  No intake or output data in the 24 hours ending 09/22/18 0041 Filed Weights   10/05/2018 2215  Weight: 60 kg    Examination: General: Well developed WF sedated ventilated HENT: NL  Lungs: Clear Cardiovascular: RRR Abdomen: Benign Extremities: WNL Neuro: sedated [place] [facility] Problem list   NA  Assessment & Plan:  1. Intracranial hemorrhage:  CT-scan of the brain- acute left thalamic bleed with caudal extension into the midbrain as well as extension into the ventricular system and subarachnoid spaces bilaterally.  Volume 10 cc with regional edema and mild regional mass-effect.  Short-term CT follow-up recommended within the next 3 hours.  CT angios head and neck-no emergent LVO.  No underlying vascular malformation other than mildly asymmetric attenuation of left PCA which  could be from vasospasm or obscuration from the blood.  Repeat MRI/MRA recommended by radiology.   2.  SP Fall:  Cervical brace in place  3.  Respiratory failure:  Vent management  4. Hx Htn:  Neurology wants patient's bp <140 mm Hg    Best practice:  Diet: NPO Pain/Anxiety/Delirium protocol (if indicated): Fentanyl prn VAP protocol (if indicated): yes DVT prophylaxis: scd GI prophylaxis: pepcid Glucose control: monitor Mobility: bed rest Code Status: full Family Communication: family per neurology Disposition: icu  Labs   CBC: Recent Labs  Lab 10/18/2018 2215 10/04/2018 2227 09/22/18 0009  WBC 12.1*  --   --   NEUTROABS 9.1*  --   --   HGB 10.4* 11.2* 10.2*  HCT 32.1* 33.0* 30.0*  MCV 88.2  --   --   PLT 304  --   --     Basic Metabolic Panel: Recent Labs  Lab 09/28/2018 2215 09/19/2018 2227 09/22/18 0009  NA 133* 135 135  K 3.5 3.6 3.5  CL 100 101  --   CO2 21*  --   --   GLUCOSE 260* 262*  --   BUN 25* 24*  --   CREATININE 1.18* 1.10*  --   CALCIUM 9.0  --   --    GFR: CrCl cannot be calculated (Unknown ideal weight.). Recent Labs  Lab 09/26/2018 2215  WBC 12.1*    Liver Function Tests: Recent Labs  Lab 09/24/2018  2215  AST 24  ALT 18  ALKPHOS 39  BILITOT 0.6  PROT 7.1  ALBUMIN 4.1   No results for input(s): LIPASE, AMYLASE in the last 168 hours. No results for input(s): AMMONIA in the last 168 hours.  ABG    Component Value Date/Time   PHART 7.439 09/22/2018 0009   PCO2ART 34.0 09/22/2018 0009   PO2ART 126.0 (H) 09/22/2018 0009   HCO3 23.2 09/22/2018 0009   TCO2 24 09/22/2018 0009   ACIDBASEDEF 1.0 09/22/2018 0009   O2SAT 99.0 09/22/2018 0009     Coagulation Profile: Recent Labs  Lab 10/16/2018 2215  INR 0.9    Cardiac Enzymes: No results for input(s): CKTOTAL, CKMB, CKMBINDEX, TROPONINI in the last 168 hours.  HbA1C: No results found for: HGBA1C  CBG: Recent Labs  Lab 09/26/2018 2217  GLUCAP 257*    Review of Systems:    Sedated ventilated, family not present on my evaluation  Past Medical History  She,  has no past medical history on file.   Surgical History   Not available  Social History      Family History   Her family history is not on file.   Allergies Not on File   Home Medications  Prior to Admission medications   Not on File     Critical care time: 35 minutes critical care time

## 2018-09-22 NOTE — Progress Notes (Signed)
NAME:  Sophia Peterson, MRN:  [id number], DOB:  [birth year], LOS: 1 ADMISSION DATE:  10/13/2018, CONSULTATION DATE:  10/07/2018 REFERRING MD:  Ronnald Nian, CHIEF COMPLAINT:  Altered mental status  Brief History   68 yo with intracranial hemorrhage   History of present illness   Patient is a 68 yo female with history HTN who fell in her garden, went inside with altered mental status.  On arrival to ER not moving the R side with right deviated gaze.  She vomited on arrival to ER.   Past Medical History   HTN, type 2 DM.   Significant Hospital Events   Admission, intubation 09/20/2018  Consults:  NA  Procedures:  ETT 7/4 >> 7/5 ventric >>  Significant Diagnostic Tests:  Head CT 7/4 >> Acute intra-axial hemorrhage centered at the left thalamus with caudal extension into the midbrain, as well as extension into the ventricular system AND subarachnoid spaces.  Head CT 7/5 >> Interval increase in size of the lateral and third ventricles consistent with developing obstructive hydrocephalus. Stable mass effect with midline shift of 9 mm, left to right, at the level of third ventricle  Micro Data:  NA  Antimicrobials:  NA  Interim history/subjective:    [place] critically ill, intubated, unresponsive Emergent ventric placed overnight due to hydrocephalus and head CT Febrile this morning  Objective   Blood pressure 131/73, pulse 74, temperature (!) 101.5 F (38.6 C), resp. rate 19, height 5\' 2"  (1.575 m), weight 72.9 kg, SpO2 100 %.    Vent Mode: PRVC FiO2 (%):  [40 %-60 %] 40 % Set Rate:  [16 bmp] 16 bmp Vt Set:  [400 mL-450 mL] 400 mL PEEP:  [5 cmH20] 5 cmH20 Plateau Pressure:  [15 cmH20-18 cmH20] 15 cmH20   Intake/Output Summary (Last 24 hours) at 09/22/2018 7494 Last data filed at 09/22/2018 0900 Gross per 24 hour  Intake 99.86 ml  Output 811 ml  Net -711.14 ml   Filed Weights   10/16/2018 2215 09/22/18 0305  Weight: 60 kg 72.9 kg    Examination: General: Well developed WF   ventilated HENT: No pallor, icterus  Lungs: Clear ventilated sounds Cardiovascular: RRR Abdomen: Soft and nontender Extremities: WNL Neuro: Purposeful spontaneous movement left leg, right hemiplegia GU:NA  Chest x-ray 7/4 personally reviewed which shows ET tube in position and no infiltrates  Resolved Hospital Problem list   NA  Assessment & Plan:  1. Intracranial hemorrhage:  CT-scan of the brain- acute left thalamic bleed with caudal extension into the midbrain as well as extension into the ventricular system and subarachnoid spaces bilaterally.    Status post ventriculostomy for increasing hydrocephalus  - EVD management per neurosurgery, currently at +10 cm   2.  SP Fall:  Cervical brace in place  3. Acute Respiratory failure: Ventilator settings reviewed and adjusted Remains to be seen how she progresses neurologically  4. Hx Htn:  Neurology wants patient's bp <140 mm Hg Will obtain details of home meds and restart    Best practice:  Diet: NPO Pain/Anxiety/Delirium protocol (if indicated): Fentanyl prn VAP protocol (if indicated): yes DVT prophylaxis: scd GI prophylaxis: pepcid Glucose control: monitor Mobility: bed rest Code Status: full Family Communication: family per neurology Disposition: icu   The patient is critically ill with multiple organ systems failure and requires high complexity decision making for assessment and support, frequent evaluation and titration of therapies, application of advanced monitoring technologies and extensive interpretation of multiple databases. Critical Care Time devoted to patient care services  described in this note independent of APP/resident  time is 31 minutes.   Kara Mead MD. Shade Flood. [place] [facility] Pager [phone] If no response call [phone]   09/22/2018

## 2018-09-22 NOTE — Consult Note (Signed)
Neurosurgery Consultation  Reason for Consult: Obstructive hydrocephalus Referring Physician: Rory Percy  CC: Obtundation  HPI: This is a 68 y.o. woman w/ h/o HTN, DM2, DLD, LKN 21:15 with sudden onset LOC. Originally brought in as a trauma, who noted gaze deviation and R hemiparesis and got a CTH which showed ICH with IVH, ICH score of 2. The patient is intubated and unable to provide any further history. She has no reported use of anticoagulant or antiplatelet medications.except ibuprofen.   ROS: A 14 point ROS was performed and is negative except as noted in the HPI but limited due to patient's depressed mental status.  PMHx: No past medical history on file. FamHx: No family history on file. SocHx:  has no history on file for tobacco, alcohol, and drug.  Exam: Vital signs in last 24 hours: Temp:  [96.7 F (35.9 C)-98.4 F (36.9 C)] 98.4 F (36.9 C) (07/05 0138) Pulse Rate:  [70-98] 74 (07/05 0200) Resp:  [13-21] 18 (07/05 0200) BP: (104-159)/(62-79) 127/76 (07/05 0200) SpO2:  [94 %-100 %] 100 % (07/05 0243) FiO2 (%):  [40 %-60 %] 40 % (07/05 0243) Weight:  [60 kg-72.9 kg] 72.9 kg (07/05 0305) General: Lying in hospital bed, appears acutely ill Head: normocephalic and atruamatic HEENT: Consistent right head version Pulmonary: intubated on ventilator, good chest rise bilaterally Cardiac: RRR Abdomen: S NT ND Extremities: warm and well perfused x4 Neuro: Intubated, eyes closed to stim, PERRL, gaze neutral, consistent right head version, moving left side spontaneously and purposefully, no response on right   Assessment and Plan: 68 y.o. woman with acute onset progressive obtundation. [place] personally reviewed, which shows left thalamic intracerebral hemorrhage with SAH and IVH, roughly 10cc in volume. Non-con axial from CTA and then repeat CTH both show progressive ventricular enlargement, c/w hydrocephalus.  -EVD placed emergently at bedside -keep EVD at +10cm H2O -CTA  negative   Judith Part, MD 09/22/18 4:47 AM [facility]

## 2018-09-22 NOTE — Progress Notes (Signed)
PT Cancellation Note  Patient Details Name: Sophia Peterson MRN: [id number] DOB: [birth year]   Cancelled Treatment:    Reason Eval/Treat Not Completed: Patient not medically ready;Active bedrest order (remains intubated).  Ellamae Sia, PT, DPT Acute Rehabilitation Services Pager [phone] Office [phone]    Willy Eddy 09/22/2018, 7:56 AM

## 2018-09-22 NOTE — Progress Notes (Signed)
SLP Cancellation Note  Patient Details Name: Sophia Peterson MRN: [id number] DOB: 08-02-1950   Cancelled treatment:       Reason Eval/Treat Not Completed: Patient not medically ready. Remains intubated. Will follow for readiness.    Isadore Bokhari, Katherene Ponto 09/22/2018, 10:59 AM

## 2018-09-23 ENCOUNTER — Inpatient Hospital Stay (HOSPITAL_COMMUNITY): Payer: Medicare HMO

## 2018-09-23 DIAGNOSIS — I629 Nontraumatic intracranial hemorrhage, unspecified: Secondary | ICD-10-CM

## 2018-09-23 DIAGNOSIS — G9340 Encephalopathy, unspecified: Secondary | ICD-10-CM

## 2018-09-23 DIAGNOSIS — J9600 Acute respiratory failure, unspecified whether with hypoxia or hypercapnia: Secondary | ICD-10-CM

## 2018-09-23 LAB — POCT I-STAT 7, (LYTES, BLD GAS, ICA,H+H)
Acid-base deficit: 1 mmol/L (ref 0.0–2.0)
Bicarbonate: 22.1 mmol/L (ref 20.0–28.0)
Calcium, Ion: 1.21 mmol/L (ref 1.15–1.40)
HCT: 30 % — ABNORMAL LOW (ref 36.0–46.0)
Hemoglobin: 10.2 g/dL — ABNORMAL LOW (ref 12.0–15.0)
O2 Saturation: 98 %
Patient temperature: 37.9
Potassium: 3.5 mmol/L (ref 3.5–5.1)
Sodium: 150 mmol/L — ABNORMAL HIGH (ref 135–145)
TCO2: 23 mmol/L (ref 22–32)
pCO2 arterial: 33 mmHg (ref 32.0–48.0)
pH, Arterial: 7.437 (ref 7.350–7.450)
pO2, Arterial: 104 mmHg (ref 83.0–108.0)

## 2018-09-23 LAB — BASIC METABOLIC PANEL
Anion gap: 14 (ref 5–15)
BUN: 13 mg/dL (ref 8–23)
CO2: 21 mmol/L — ABNORMAL LOW (ref 22–32)
Calcium: 8.8 mg/dL — ABNORMAL LOW (ref 8.9–10.3)
Chloride: 111 mmol/L (ref 98–111)
Creatinine, Ser: 1.07 mg/dL — ABNORMAL HIGH (ref 0.44–1.00)
GFR calc Af Amer: >60 mL/min (ref >60)
GFR calc non Af Amer: 54 mL/min — ABNORMAL LOW (ref >60)
Glucose, Bld: 191 mg/dL — ABNORMAL HIGH (ref 70–99)
Potassium: 4.1 mmol/L (ref 3.5–5.1)
Sodium: 146 mmol/L — ABNORMAL HIGH (ref 135–145)

## 2018-09-23 LAB — LIPID PANEL
Cholesterol: 85 mg/dL (ref 0–200)
HDL: 43 mg/dL (ref >40)
LDL Cholesterol: 24 mg/dL (ref 0–99)
Total CHOL/HDL Ratio: 2 RATIO
Triglycerides: 92 mg/dL (ref <150)
VLDL: 18 mg/dL (ref 0–40)

## 2018-09-23 LAB — SODIUM
Sodium: 151 mmol/L — ABNORMAL HIGH (ref 135–145)
Sodium: 155 mmol/L — ABNORMAL HIGH (ref 135–145)
Sodium: 154 mmol/L — ABNORMAL HIGH (ref 135–145)

## 2018-09-23 LAB — GLUCOSE, CAPILLARY
Glucose-Capillary: 122 mg/dL — ABNORMAL HIGH (ref 70–99)
Glucose-Capillary: 136 mg/dL — ABNORMAL HIGH (ref 70–99)
Glucose-Capillary: 140 mg/dL — ABNORMAL HIGH (ref 70–99)
Glucose-Capillary: 169 mg/dL — ABNORMAL HIGH (ref 70–99)
Glucose-Capillary: 174 mg/dL — ABNORMAL HIGH (ref 70–99)
Glucose-Capillary: 189 mg/dL — ABNORMAL HIGH (ref 70–99)
Glucose-Capillary: 222 mg/dL — ABNORMAL HIGH (ref 70–99)

## 2018-09-23 LAB — CBC
HCT: 32.5 % — ABNORMAL LOW (ref 36.0–46.0)
Hemoglobin: 10.5 g/dL — ABNORMAL LOW (ref 12.0–15.0)
MCH: 28.8 pg (ref 26.0–34.0)
MCHC: 32.3 g/dL (ref 30.0–36.0)
MCV: 89.3 fL (ref 80.0–100.0)
Platelets: 254 10*3/uL (ref 150–400)
RBC: 3.64 MIL/uL — ABNORMAL LOW (ref 3.87–5.11)
RDW: 13.6 % (ref 11.5–15.5)
WBC: 12.4 10*3/uL — ABNORMAL HIGH (ref 4.0–10.5)
nRBC: 0 % (ref 0.0–0.2)

## 2018-09-23 LAB — PROCALCITONIN: Procalcitonin: 0.1 ng/mL

## 2018-09-23 MED ORDER — PRO-STAT SUGAR FREE PO LIQD
30.0000 mL | Freq: Two times a day (BID) | ORAL | Status: DC
  Administered 2018-09-23 (×2): 30 mL
  Filled 2018-09-23 (×2): qty 30

## 2018-09-23 MED ORDER — VITAL 1.5 CAL PO LIQD
1000.0000 mL | ORAL | Status: DC
  Filled 2018-09-23: qty 1000

## 2018-09-23 MED ORDER — FENTANYL CITRATE (PF) 100 MCG/2ML IJ SOLN: 50.0000 ug | INTRAMUSCULAR | Status: DC | PRN

## 2018-09-23 MED ORDER — SODIUM CHLORIDE 0.9% FLUSH: 10.0000 mL | INTRAVENOUS | Status: DC | PRN

## 2018-09-23 MED ORDER — VITAL HIGH PROTEIN PO LIQD: 1000.0000 mL | ORAL | Status: DC

## 2018-09-23 MED ORDER — MIDAZOLAM HCL 2 MG/2ML IJ SOLN: 4.0000 mg | INTRAMUSCULAR | Status: DC | PRN

## 2018-09-23 MED ORDER — VITAL 1.5 CAL PO LIQD: 1000.0000 mL | ORAL | Status: DC

## 2018-09-23 MED ORDER — PANTOPRAZOLE SODIUM 40 MG PO PACK
40.0000 mg | PACK | Freq: Every day | ORAL | Status: DC
  Administered 2018-09-23: 40 mg
  Filled 2018-09-23: qty 20

## 2018-09-23 MED ORDER — VITAL 1.5 CAL PO LIQD
1000.0000 mL | ORAL | Status: DC
  Administered 2018-09-23: 1000 mL
  Filled 2018-09-23 (×2): qty 1000

## 2018-09-23 MED ORDER — SODIUM CHLORIDE 0.9% FLUSH
10.0000 mL | Freq: Two times a day (BID) | INTRAVENOUS | Status: DC
  Administered 2018-09-23 – 2018-09-24 (×3): 10 mL

## 2018-09-23 MED ORDER — CHLORHEXIDINE GLUCONATE CLOTH 2 % EX PADS
6.0000 | MEDICATED_PAD | Freq: Every day | CUTANEOUS | Status: DC
  Administered 2018-09-23: 6 via TOPICAL

## 2018-09-23 NOTE — Progress Notes (Signed)
Assisted tele visit to patient with family member.  Tanetta Fuhriman McEachran, RN  

## 2018-09-23 NOTE — Progress Notes (Signed)
NAME:  Sophia Peterson, MRN:  [id number], DOB:  Nov 22, 1950, LOS: 2 ADMISSION DATE:  10/08/2018, CONSULTATION DATE:  10/12/2018 REFERRING MD:  Ronnald Nian, CHIEF COMPLAINT:  Altered mental status  Brief History   68 yo with intracranial hemorrhage   History of present illness   Patient is a 68 yo female with history HTN who fell in her garden, went inside with altered mental status.  On arrival to ER not moving the R side with right deviated gaze.  She vomited on arrival to ER.   Past Medical History   HTN, type 2 DM.   Significant Hospital Events   Admission, intubation 09/20/2018  Consults:  NA  Procedures:  ETT 7/4 >> 7/5 ventric >> 7/6 LUE PICC >>  Significant Diagnostic Tests:  Head CT 7/4 >> Acute intra-axial hemorrhage centered at the left thalamus with caudal extension into the midbrain, as well as extension into the ventricular system AND subarachnoid spaces.  Head CT 7/5 >> Interval increase in size of the lateral and third ventricles consistent with developing obstructive hydrocephalus. Stable mass effect with midline shift of 9 mm, left to right, at the level of third ventricle  Micro Data:  resp 7/6 >> Blood 7/6 >>  Antimicrobials:  NA  Interim history/subjective:   She remains critically ill, febrile, intubated Off sedation  On hypertonic saline with EVD   Objective   Blood pressure 117/60, pulse 72, temperature (!) 100.6 F (38.1 C), resp. rate 16, height 5\' 2"  (1.575 m), weight 71.1 kg, SpO2 100 %.    Vent Mode: PRVC FiO2 (%):  [40 %] 40 % Set Rate:  [16 bmp] 16 bmp Vt Set:  [400 mL] 400 mL PEEP:  [5 cmH20] 5 cmH20 Plateau Pressure:  [14 cmH20-16 cmH20] 16 cmH20   Intake/Output Summary (Last 24 hours) at 09/23/2018 1109 Last data filed at 09/23/2018 1000 Gross per 24 hour  Intake 1512.13 ml  Output 640 ml  Net 872.13 ml   Filed Weights   10/07/2018 2215 09/22/18 0305 09/23/18 0409  Weight: 60 kg 72.9 kg 71.1 kg    Examination: General: Well  developed WF  ventilated HENT: No pallor, icterus  Lungs: Decreased bilateral, no rhonchi Cardiovascular: RRR Abdomen: Soft and nontender Extremities: WNL Neuro: Decerebrate posturing on left, decorticate on right upper extremity, unresponsive, does not follow commands GU:NA  Chest x-ray 7/6 personally reviewed, ET tube in position, platelike atelectasis right lower  Zone, doubt retrocardiac infiltrate  Resolved Hospital Problem list   NA  Assessment & Plan:  1. Intracranial hemorrhage:  CT-scan of the brain- acute left thalamic bleed with caudal extension into the midbrain as well as extension into the ventricular system and subarachnoid spaces bilaterally.    Status post ventriculostomy for increasing hydrocephalus s/p tpa 7/6 -appears to be working  - EVD management per neurosurgery, currently at +10 cm -Goal RA SS 0 to -1, use fentanyl intermittent and minimize benzos to enable frequent neuro checks -Continue hypertonic saline with sodium checks   2. Acute Respiratory failure: Ventilator settings reviewed and adjusted Mental status limiting factor for extubation  Fever/mild  leukocytosis-likely related to blood in ventricles, will obtain cultures for completion and procalcitonin, if high then will consider starting an antibiotic for aspiration   Summary-worsening GCS indicating increased intracranial pressure in spite of functional EVD likely indicates worse prognosis  Best practice:  Diet: NPO Pain/Anxiety/Delirium protocol (if indicated): Fentanyl prn VAP protocol (if indicated): yes DVT prophylaxis: scd GI prophylaxis: pepcid Glucose control: monitor Mobility: bed  rest Code Status: full Family Communication: family per neurology Disposition: icu   The patient is critically ill with multiple organ systems failure and requires high complexity decision making for assessment and support, frequent evaluation and titration of therapies, application of advanced monitoring  technologies and extensive interpretation of multiple databases. Critical Care Time devoted to patient care services described in this note independent of APP/resident  time is 32 minutes.       Kara Mead MD. Shade Flood. [place] [facility] Pager [phone] If no response call [phone]   09/23/2018

## 2018-09-23 NOTE — Progress Notes (Signed)
RN spoke with Patients son Mortimer Fries. After speaking with Dr. Leonie Man today, family has decided to withdrawal support tomorrow around noon. MD on call notified. RN will continue to monitor patient and keep in touch with family.

## 2018-09-23 NOTE — Progress Notes (Signed)
STROKE TEAM PROGRESS NOTE   SUBJECTIVE (INTERVAL HISTORY) The patient had neurological worsening yesterday evening and ventriculostomy catheter stopped draining.  Stat CT scan of the head was obtained which showed slight increase in hematoma size as well as enlargement of the right temporal horn.  Dr. Venetia Constable use intra-thecal TPA and ventriculostomy catheter is now draining but neurological exam remains poor.  She remains unresponsive.  She is on hypertonic saline and serum sodium is 151.  She has temperature 100.9 and blood pressure is adequately controlled.  She remains on ventilatory support for respiratory failure  OBJECTIVE Vitals:   09/23/18 0735 09/23/18 0800 09/23/18 0809 09/23/18 0900  BP: (!) 107/48 (!) 120/53 (!) 120/53 127/60  Pulse: 81 92  70  Resp: 20 (!) 22  17  Temp: (!) 100.9 F (38.3 C) (!) 100.9 F (38.3 C)  (!) 100.8 F (38.2 C)  TempSrc:      SpO2: 100% 100% 100% 100%  Weight:      Height:        CBC:  Recent Labs  Lab 09/19/2018 2215  09/22/18 0254 09/23/18 0044 09/23/18 0411  WBC 12.1*  --  11.6* 12.4*  --   NEUTROABS 9.1*  --  10.0*  --   --   HGB 10.4*   < > 9.8* 10.5* 10.2*  HCT 32.1*   < > 29.7* 32.5* 30.0*  MCV 88.2  --  87.1 89.3  --   PLT 304  --  236 254  --    < > = values in this interval not displayed.    Basic Metabolic Panel:  Recent Labs  Lab 09/22/18 0254  09/23/18 0044 09/23/18 0411 09/23/18 0645  NA 134*   < > 146* 150* 151*  K 4.2  --  4.1 3.5  --   CL 98  --  111  --   --   CO2 23  --  21*  --   --   GLUCOSE 219*  --  191*  --   --   BUN 21  --  13  --   --   CREATININE 1.08*  --  1.07*  --   --   CALCIUM 9.1  --  8.8*  --   --    < > = values in this interval not displayed.    Lipid Panel:     Component Value Date/Time   CHOL 85 09/23/2018 0044   TRIG 92 09/23/2018 0044   HDL 43 09/23/2018 0044   CHOLHDL 2.0 09/23/2018 0044   VLDL 18 09/23/2018 0044   LDLCALC 24 09/23/2018 0044   HgbA1c:  Lab Results   Component Value Date   HGBA1C 9.3 (H) 10/10/2018   Urine Drug Screen:     Component Value Date/Time   LABOPIA NONE DETECTED 09/22/2018 0212   COCAINSCRNUR NONE DETECTED 09/22/2018 0212   LABBENZ POSITIVE (A) 09/22/2018 0212   AMPHETMU NONE DETECTED 09/22/2018 0212   THCU NONE DETECTED 09/22/2018 0212   LABBARB NONE DETECTED 09/22/2018 0212    Alcohol Level     Component Value Date/Time   ETH <10 10/03/2018 2215    IMAGING Ct Head Wo Contrast 09/22/2018 2235 1. EVD position appears stable although ventricle size has significantly increased since this morning, with mild transependymal edema suspected now. 2. Hemorrhage in the left thalamus and brainstem appears slightly larger since presentation yesterday (17 mL now vs. 13 mL yesterday when measured in the same way). 3. Stable volume of intraventricular and  subarachnoid hemorrhage.   Ct Head Wo Contrast 09/22/2018 1045 1. Mild dilatation of the right greater than left lateral ventricles, decreased status post right frontal approach ventriculostomy catheter placement, with catheter traversing the body of the right lateral ventricle and terminating in the midline. 2. Hyperdense left thalamic/left midbrain hemorrhage is slightly increased. Stable subarachnoid hemorrhage in basilar cisterns. Stable fourth ventricle and cerebral aqueduct hemorrhage. 3. Right midline shift 4 mm, previously 5 mm using similar measurement technique, not significantly changed.   Ct Head Wo Contrast 09/22/2018 0242 1. Interval increased size of both lateral and third ventricles compatible with developing obstructive hydrocephalus.  2. Stable acute hemorrhage centered within left thalamus extending into left midbrain and upper pons. Stable extra-axial hemorrhage in fourth ventricle and posterior fossa cisterns.  Ct Angio Head W Or Wo Contrast Ct Angio Neck W Or Wo Contrast 10/17/2018 1. Patent carotid and vertebral arteries. No dissection, aneurysm, or  hemodynamically significant stenosis utilizing NASCET criteria.  2. Patent anterior and posterior intracranial circulation. No large vessel occlusion, aneurysm, or vascular malformation.  3. Mild asymmetric attenuation of left PCA, possibly early vasospasm.  4. Acute hemorrhage within left thalamus extending into brainstem as well as the ventricular system and basilar subarachnoid space is grossly stable from prior CT head.   Ct Cervical Spine Wo Contrast 10/14/2018 1. No acute fracture or subluxation of the cervical spine.  2. Multilevel degenerative disc disease, most prominent at C5-C6.   Ct Head Code Stroke Wo Contrast  09/18/2018 1. Acute intra-axial hemorrhage centered at the left thalamus with caudal extension into the midbrain, as well as extension into the ventricular system AND subarachnoid spaces.  2. Estimated intra-axial blood volume 10 mL. Regional edema and mild regional mass effect.   Dg Pelvis Portable 10/13/2018 Negative.   Dg Chest Portable 1 View 09/23/2018 1. Progressive airspace opacity at the left base. While this may represent atelectasis, infection or aspiration is considered. 2. New left pleural effusion. 3. Increased linear opacity in the right lower lobe likely reflects atelectasis.  10/07/2018 1. Endotracheal tube tip about 11 mm superior to carina  2. Low lung volumes with linear atelectasis or scarring at the bases. Slightly more patchy and focal opacity in the left infrahilar lung may reflect atelectasis, pneumonia or aspiration   EKG - SR rate 82 BPM. (See cardiology reading for complete details)   PHYSICAL EXAM Blood pressure 127/60, pulse 70, temperature (!) 100.8 F (38.2 C), resp. rate 17, height 5\' 2"  (1.575 m), weight 71.1 kg, SpO2 100 %. Frail middle-aged lady who is intubated and not sedated. . Afebrile. Head is nontraumatic. Neck is supple without bruit.    Cardiac exam no murmur or gallop. Lungs are clear to auscultation. Distal pulses are well felt.   She has right frontal ventriculostomy catheter Neurological Exam :  Patient is intubated.  Not on sedation.  Eyes are closed.  Opens eyes partially to sternal rub.  She does not follow any commands.  Left gaze deviation.  Doll's eye movements are sluggish pupils are 3 mm sluggishly reactive.  Corneal reflexes are present.  She does have a week cough and gag.  Motor system exam shows extensor posturing of the right greater than left upper extremity to sternal rub.  There is trace withdrawal in lower extremities with partial flexion left greater than right.   ASSESSMENT/PLAN [name] is a 68 y.o. female with history of hypertension, diabetes, and hyperlipidemia presenting with collapse, right sided weakness, right gaze preference and vomiting.  She did not receive IV t-PA due to [place].  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  CTA H&N - No large vessel occlusion, aneurysm, or vascular malformation. Mild asymmetric attenuation of left PCA, possibly early vasospasm.   CT head 7/5 - Interval increased size of both lateral and third ventricles compatible with developing obstructive hydrocephalus.   Repeat CT head 7/5 mild dilated R>L lateral ventricles s/p EVD. Hyperdense L thalamic/L midbrain hmg. Stable SAH. R MLS 34mm  Repeat CT head 7/5 EVD in placed, increased size L ventricle, Hmg slightly larger 13->17 mL  Carotid Doppler - CTA neck performed - carotid dopplers not indicated.  2D Echo - not indicated  [employer] Virus 2  - negative  LDL - 24  HgbA1c - 9.3  UDS - benzodiazepines  VTE prophylaxis - SCDs  Diet - NPO  aspirin 81 mg daily prior to admission, now on No antithrombotic given hmg  Therapy recommendations:   pending  Disposition:  Pending  Acute Respiratory Failure  Intubated  Mental status limiting factor for extubation  CCM on board  Leukocytosis and Fever  TM 101.8  WBC 12.4  CXR progressive airspace opacity L base ? Infection/aspiration. New L pleural effusion. Increased RLL atx  Not on abx  Cultures pending   Obstructive Hydrocephalus Induced Hypernatremia Cerebral Edema  EVD placed 7/5 (Nundkumar)  Started on 3% protocol at [place]  For PICC placement today  Na 151  Check Na q6h  Goal Na 150-155  Hypertension  Highest recorded BP 152/78  Home meds:  Valsartan-HCTZ 160-12.5 daily  BP Stable . Systolic blood pressure goal < 160 mmHg . On no BP meds . Long-term BP goal normotensive  Hyperlipidemia  Home Meds: crestor 10  LDL 24  crestor held given hmg and risk of hmg w/ low LDL  Diabetes  Home meds: metformin 1000 bid, actor 15  HgbA1c 9.3, goal < 7.0  Uncontrolled  SSI  CBG  Other Stroke Risk Factors  Advanced age  Cigarette smoker - not on file  ETOH - not on file  Overweight, Body mass index is 28.67 kg/m., recommend weight loss, diet and exercise as appropriate   Family hx stroke - not on file   Other Active Problems  Fall, cervical brace in place  Anemia - Hb - 10.4->10.2->9.8->10.5  Creatinine - 1.18->1.10->1.08->1.07  Hospital day # 2 The patient has unfortunately left neurological worsening due to worsening obstructive hydrocephalus and slight increase in hematoma size despite ventriculostomy.  Intraventricular ostomy catheter TPA seems to have improved drainage somewhat but neurological exam remains poor continue hypertonic saline with serum sodium goal 150-155.  Patient's prognosis is quite poor.  I had a long discussion with the patient's husband as well as 2 sons over the phone and explained the poor prognosis and chances of making significant recovery and meaningful improvement with independent quality of life being  nonexistent.  Family wants some time to think about goals of care and would like to have in person visit with the patient and meet with me tomorrow.  Discussed with Dr. Venetia Constable and Dr. Elsworth Soho This patient is critically ill  With IVH, obstructive hydrocephalus, cytotoxic edema, resp failureand at significant risk of neurological worsening, death and care requires constant monitoring of  vital signs, hemodynamics,respiratory and cardiac monitoring, extensive review of multiple databases, frequent neurological assessment, discussion with family, other specialists and medical decision making of high complexity.I have made any additions or clarifications directly to the above note.This critical care time does not reflect procedure time, or teaching time or supervisory time of PA/NP/Med Resident etc but could involve care discussion time.  I spent 32 minutes of neurocritical care time  in the care of  this patient.     Antony Contras, MD Medical Director [facility] Pager: [phone] 09/23/2018 9:34 AM   To contact Stroke Continuity provider, please refer to [email or web address] After hours, contact General Neurology

## 2018-09-23 NOTE — Progress Notes (Signed)
Initial Nutrition Assessment  DOCUMENTATION CODES:   Not applicable  INTERVENTION:   Pt started on trickle TF today  Vital 1.5  @ 20 ml/hr provides: 720 kcal and 32 grams of protein  Advance Vital 1.5 to goal rate of 40 ml/hr 60 ml Prostat BID  Provides: 1640 kcal, 95 grams of protein, and 733 ml free water.    NUTRITION DIAGNOSIS:   Inadequate oral intake related to inability to eat as evidenced by NPO status.  GOAL:   Patient will meet greater than or equal to 90% of their needs  MONITOR:   Vent status, TF tolerance, I & O's  REASON FOR ASSESSMENT:   Consult, Ventilator Enteral/tube feeding initiation and management  ASSESSMENT:   Pt with PMH of HTN and DM who fell in her garden and admitted with acute L thalamic bleed with extension in the midbrain and ventricles s.p EVD.    Pt discussed during ICU rounds and with RN.  Per RN and MD pt appears to be worsening and now posturing.  Emesis x 2 on admission  Patient is currently intubated on ventilator support MV: 6.9 L/min Temp (24hrs), Avg:100.6 F (38.1 C), Min:99.7 F (37.6 C), Max:101.8 F (38.8 C)  Medications reviewed and include: SSI, senokot-s 3% hypertonic saline  Labs reviewed    NUTRITION - FOCUSED PHYSICAL EXAM:  Deferred, pt unavailable at this time  Diet Order:   Diet Order            Diet NPO time specified  Diet effective now              EDUCATION NEEDS:   No education needs have been identified at this time  Skin:  Skin Assessment: Reviewed RN Assessment  Last BM:  unknown, active bowel sounds  Height:   Ht Readings from Last 1 Encounters:  09/22/18 5\' 2"  (1.575 m)    Weight:   Wt Readings from Last 1 Encounters:  09/23/18 71.1 kg    Ideal Body Weight:  50 kg  BMI:  Body mass index is 28.67 kg/m.  Estimated Nutritional Needs:   Kcal:  1642  Protein:  85-105 grams  Fluid:  > 1.6 L/day  Maylon Peppers RD, LDN, CNSC [phone] Pager [phone] After Hours  Pager

## 2018-09-23 NOTE — Progress Notes (Signed)
Spoke with Dr.Sethi about pupils becoming nonreactive and NA 155 advised to hold 3% gtt until next NA check. Will continue to monitor.

## 2018-09-23 NOTE — Progress Notes (Signed)
OT Cancellation Note  Patient Details Name: Sophia Peterson MRN: [id number] DOB: 05-Dec-1950   Cancelled Treatment:    Reason Eval/Treat Not Completed: Medical issues which prohibited therapy Pt is posturing and intubated  Richelle Ito, OTR/L  Acute Rehabilitation Services Pager: [phone] Office: [phone] .  09/23/2018, 8:08 AM

## 2018-09-23 NOTE — Progress Notes (Signed)
NAME:  Sophia Peterson, MRN:  [id number], DOB:  1951-02-22, LOS: 2 ADMISSION DATE:  09/20/2018, CONSULTATION DATE:  10/14/2018 REFERRING MD:  Ronnald Nian, CHIEF COMPLAINT:  Altered mental status  Brief History   68 yo with L thalamic intracranial hemorrhage.   History of present illness   Patient is a 68 yo female with history HTN who fell in her garden, went inside with altered mental status.  On arrival to ER not moving the R side with right deviated gaze.  She vomited on arrival to ER.   Past Medical History   HTN, type 2 DM, HLD  Significant Hospital Events   Admission, intubation 09/20/2018  Consults:  N/A  Procedures:  7/4 ETT >> 7/5 ventric >> 7/6 L PICC >>  Significant Diagnostic Tests:  Head CT 7/4 >> Acute intra-axial hemorrhage centered at the left thalamus with caudal extension into the midbrain, as well as extension into the ventricular system AND subarachnoid spaces.  Head CT 7/5 >> Interval increase in size of the lateral and third ventricles consistent with developing obstructive hydrocephalus. Stable mass effect with midline shift of 9 mm, left to right, at the level of third ventricle  Head CT 7/5 >> Interval increase in ventricle size despite stable EVD position. Interval increase in L thalamic and brainstem hemorrhage (17 mL from 35mL). Stable IVH and SAH.   Micro Data:  7/6 Respiratory Cx >> 7/6 Blood Cx >>  Antimicrobials:  N/A  Interim history/subjective:  EVD stopped draining overnight. CT without improvement in ventricle size. tPA flush to EVD, with resumption of drainage. Patient with new decerebrate posturing. Remains unresponsive off sedation. Febrile to 38.2 this morning. Has been intermittently febrile since admission.  Objective   Blood pressure 117/60, pulse 72, temperature (!) 100.6 F (38.1 C), resp. rate 16, height 5\' 2"  (1.575 m), weight 71.1 kg, SpO2 100 %.    Vent Mode: PRVC FiO2 (%):  [40 %] 40 % Set Rate:  [16 bmp] 16 bmp Vt Set:   [400 mL] 400 mL PEEP:  [5 cmH20] 5 cmH20 Plateau Pressure:  [14 cmH20-16 cmH20] 16 cmH20   Intake/Output Summary (Last 24 hours) at 09/23/2018 1136 Last data filed at 09/23/2018 1000 Gross per 24 hour  Intake 1512.13 ml  Output 640 ml  Net 872.13 ml   Filed Weights   10/06/2018 2215 09/22/18 0305 09/23/18 0409  Weight: 60 kg 72.9 kg 71.1 kg    Examination: General: Ill-appearing, well-developed woman, on ventillator HENT: Pupils constricted b/l, sluggishly reactive to light. No scleral icterus. Lungs: CTA anteriorly Cardiovascular: RRR, no m/r/g Abdomen: Soft and nontender. + BS Extremities: Warm. No peripheral edema Neuro: Unresponsive. Decorticate posturing in RUE. Decerebrate posturing elsewhere. GU: Foley in place draining amber-colored urine  Chest x-ray 7/6 personally reviewed: ET tube in position, plate-like atelectasis in RLL, doubt retrocardiac infiltrates  Resolved Hospital Problem list   N/A  Assessment & Plan:   Neurological L Thalamic Intracranial Hemorrhage: - CT Brain - acute left thalamic bleed with caudal extension into the midbrain as well as extension into the ventricular system and subarachnoid spaces bilaterally.  - s/p ventriculostomy for increasing hydrocephalus on 7/5 - s/p tpa flush to EVD on 7/6, now draining - EVD at 10 cmH2O,  management per Neurosurgery - Na+ therapeutic, continue hypertonic saline and q6 checks  Cardiovascular HTN: Normotensive at present - Goal SBP <140, per Neuro - hydralazine and labetalol available PRN  Respiratory Acute Respiratory Failure: - Vent settings reviewed and adjusted - Unable to  extubate 2/2 mental status  Atelectasis: - CXR 7/6 with plate-like atelectasis in RLL - Mechanical ventilation as above  Endocrine T2DM: Hgb A1C 9.3% - CBG 381>829>937 - SSI  Infectious Fever  Leukocytosis: Likely 2/2 brain irritation from intracranial hemorrhage - Low concern for L base infection or aspiration on CXR. Less  concerning on film review. - Respiratory and Blood cultures sent - Check pro-calcitonin. If elevated, will consider starting antibiotics for aspiration pneumonia.  Dispo: Poor prognosis given worsening mental status and signs of increasing intracranial pressure.     Best practice:  Diet: NPO Pain/Anxiety/Delirium protocol (if indicated): Fentanyl prn VAP protocol (if indicated): per protocol DVT prophylaxis: SCDs GI prophylaxis: Protonix Glucose control: per protocol Mobility: Bed rest Code Status: Full Family Communication: family per neurology Disposition: ICU   Critical care time: 35 minutes   -- Armida Sans, [facility] 09/23/2018

## 2018-09-23 NOTE — Progress Notes (Addendum)
PT Cancellation Note  Patient Details Name: Sophia Peterson MRN: [id number] DOB: January 30, 1951   Cancelled Treatment:    Reason Eval/Treat Not Completed: Active bedrest order Pt posturing and intubated. Will follow for appropriateness.   Marguarite Arbour A Lashon Beringer 09/23/2018, 7:15 AM Wray Kearns, PT, DPT Acute Rehabilitation Services Pager [phone] Office [phone]

## 2018-09-23 NOTE — Progress Notes (Signed)
Brachial artery accessed during left upper arm PICC insertion, needle and tourniquet removed immediately and pressured held. Bleeding stopped, no notable bruising. PICC insertion continued without difficulty. RN at bedside aware. Will continue to monitor.

## 2018-09-23 NOTE — Progress Notes (Signed)
Peripherally Inserted Central Catheter/Midline Placement  The IV Nurse has discussed with the patient and/or persons authorized to consent for the patient, the purpose of this procedure and the potential benefits and risks involved with this procedure.  The benefits include less needle sticks, lab draws from the catheter, and the patient may be discharged home with the catheter. Risks include, but not limited to, infection, bleeding, blood clot (thrombus formation), and puncture of an artery; nerve damage and irregular heartbeat and possibility to perform a PICC exchange if needed/ordered by physician.  Alternatives to this procedure were also discussed.  Bard Power PICC patient education guide, fact sheet on infection prevention and patient information card has been provided to patient /or left at bedside.    PICC/Midline Placement Documentation  PICC Double Lumen 09/23/18 PICC Left Brachial 39 cm 0 cm (Active)  Indication for Insertion or Continuance of Line Prolonged intravenous therapies 09/23/18 1000  Exposed Catheter (cm) 0 cm 09/23/18 1000  Site Assessment Clean;Dry;Intact 09/23/18 1000  Lumen #1 Status Flushed;Blood return noted 09/23/18 1000  Lumen #2 Status Flushed;Blood return noted 09/23/18 1000  Dressing Type Transparent 09/23/18 1000  Dressing Status Clean;Dry;Intact;Antimicrobial disc in place 09/23/18 1000  Dressing Change Due 09/30/18 09/23/18 1000       Jule Economy Horton 09/23/2018, 10:09 AM

## 2018-09-23 NOTE — Progress Notes (Signed)
Per Dr. Elsworth Soho no need for ab x ray. OK to use existing OG tube.

## 2018-09-23 NOTE — Progress Notes (Signed)
Neurosurgery Service Progress Note  Subjective: EVD clotted off overnight, resolved w/ tPA   Objective: Vitals:   09/23/18 0735 09/23/18 0800 09/23/18 0809 09/23/18 0900  BP: (!) 107/48 (!) 120/53 (!) 120/53 127/60  Pulse: 81 92  70  Resp: 20 (!) 22  17  Temp: (!) 100.9 F (38.3 C) (!) 100.9 F (38.3 C)  (!) 100.8 F (38.2 C)  TempSrc:      SpO2: 100% 100% 100% 100%  Weight:      Height:       Temp (24hrs), Avg:100.7 F (38.2 C), Min:99.7 F (37.6 C), Max:101.8 F (38.8 C)  CBC Latest Ref Rng & Units 09/23/2018 09/23/2018 09/22/2018  WBC 4.0 - 10.5 K/uL - 12.4(H) 11.6(H)  Hemoglobin 12.0 - 15.0 g/dL 10.2(L) 10.5(L) 9.8(L)  Hematocrit 36.0 - 46.0 % 30.0(L) 32.5(L) 29.7(L)  Platelets 150 - 400 K/uL - 254 236   BMP Latest Ref Rng & Units 09/23/2018 09/23/2018 09/23/2018  Glucose 70 - 99 mg/dL - - 191(H)  BUN 8 - 23 mg/dL - - 13  Creatinine 0.44 - 1.00 mg/dL - - 1.07(H)  Sodium 135 - 145 mmol/L 151(H) 150(H) 146(H)  Potassium 3.5 - 5.1 mmol/L - 3.5 4.1  Chloride 98 - 111 mmol/L - - 111  CO2 22 - 32 mmol/L - - 21(L)  Calcium 8.9 - 10.3 mg/dL - - 8.8(L)    Intake/Output Summary (Last 24 hours) at 09/23/2018 0922 Last data filed at 09/23/2018 0900 Gross per 24 hour  Intake 1512.13 ml  Output 703 ml  Net 809.13 ml    Current Facility-Administered Medications:  .  acetaminophen (TYLENOL) tablet 650 mg, 650 mg, Oral, Q4H PRN **OR** acetaminophen (TYLENOL) solution 650 mg, 650 mg, Per Tube, Q4H PRN, 650 mg at 09/22/18 1310 **OR** acetaminophen (TYLENOL) suppository 650 mg, 650 mg, Rectal, Q4H PRN, Amie Portland, MD, 650 mg at 09/23/18 0746 .  albuterol (PROVENTIL) (2.5 MG/3ML) 0.083% nebulizer solution 2.5 mg, 2.5 mg, Nebulization, Q4H PRN, Shellia Cleverly, MD .  chlorhexidine gluconate (MEDLINE KIT) (PERIDEX) 0.12 % solution 15 mL, 15 mL, Mouth Rinse, BID, Shellia Cleverly, MD, 15 mL at 09/23/18 0739 .  Chlorhexidine Gluconate Cloth 2 % PADS 6 each, 6 each, Topical, Q0600, Amie Portland,  MD, 6 each at 09/23/18 [phone] .  [COMPLETED] labetalol (NORMODYNE) injection 20 mg, 20 mg, Intravenous, Once, 20 mg at 09/22/18 0810 **AND** clevidipine (CLEVIPREX) infusion 0.5 mg/mL, 0-21 mg/hr, Intravenous, Continuous, Amie Portland, MD .  famotidine (PEPCID) IVPB 20 mg premix, 20 mg, Intravenous, Q12H, Shellia Cleverly, MD, Stopped at 09/22/18 2319 .  fentaNYL (SUBLIMAZE) injection 50 mcg, 50 mcg, Intravenous, Q1H PRN, Shellia Cleverly, MD .  hydrALAZINE (APRESOLINE) injection 10 mg, 10 mg, Intravenous, Q6H PRN, Leonie Man, Pramod S, MD .  insulin aspart (novoLOG) injection 0-15 Units, 0-15 Units, Subcutaneous, Q4H, Singasani, Ephriam Jenkins, MD, 2 Units at 09/23/18 0827 .  labetalol (NORMODYNE) injection 20 mg, 20 mg, Intravenous, Q2H PRN, Garvin Fila, MD, 20 mg at 09/22/18 1620 .  MEDLINE mouth rinse, 15 mL, Mouth Rinse, 10 times per day, Shellia Cleverly, MD, 15 mL at 09/23/18 1962 .  midazolam (VERSED) injection 4 mg, 4 mg, Intravenous, Q1H PRN, Curatolo, Adam, DO, 4 mg at 09/22/18 0026 .  pantoprazole (PROTONIX) injection 40 mg, 40 mg, Intravenous, QHS, Amie Portland, MD, 40 mg at 09/22/18 2248 .  propofol (DIPRIVAN) 1000 MG/100ML infusion, 5-80 mcg/kg/min, Intravenous, Titrated, Singasani, Ephriam Jenkins, MD, Stopped at 09/22/18 0720 .  senna-docusate (  Senokot-S) tablet 1 tablet, 1 tablet, Per Tube, BID, Garvin Fila, MD, 1 tablet at 09/22/18 2248 .  sodium chloride (hypertonic) 3 % solution, , Intravenous, Continuous, Garvin Fila, MD, Last Rate: 75 mL/hr at 09/23/18 0900   Physical Exam: Eyes closed to stimulation, L gaze deviation, pupils sluggish, +c/c/g, extensor on the right and w/d on the L with BLE withdrawal, not FC EVD site c/d/i  Assessment & Plan: 68 y.o. woman with ICH/IVH and hydrocephalus, 7/5 s/p R F EVD. Post-EVD CT w/ good position, decompressed vents 7/5 late PM EVD clotted and non-functional, rpt CTH with enlarged vents, flushed w/ tPA and cleared.  -continue EVD at +0 to try  and minimize clotting -I agree that, given her exam and the extension of the clot into her brainstem, she has a very poor prognosis. I do not think it would be appropriate to place a shunt if she were to fail a clamp trial. Will discuss in the AM with the rest of the team.   Judith Part  09/23/18 9:22 AM

## 2018-09-23 NOTE — Progress Notes (Signed)
Noted 6 point jump in Sodium- RN Notified MD. Plan to continue 3% and sodium labs at this time. RN will continue to monitor.

## 2018-09-24 ENCOUNTER — Inpatient Hospital Stay (HOSPITAL_COMMUNITY): Payer: Medicare HMO

## 2018-09-24 LAB — BASIC METABOLIC PANEL
Anion gap: 10 (ref 5–15)
BUN: 21 mg/dL (ref 8–23)
CO2: 23 mmol/L (ref 22–32)
Calcium: 8.8 mg/dL — ABNORMAL LOW (ref 8.9–10.3)
Chloride: 121 mmol/L — ABNORMAL HIGH (ref 98–111)
Creatinine, Ser: 1.11 mg/dL — ABNORMAL HIGH (ref 0.44–1.00)
GFR calc Af Amer: 60 mL/min — ABNORMAL LOW (ref >60)
GFR calc non Af Amer: 52 mL/min — ABNORMAL LOW (ref >60)
Glucose, Bld: 263 mg/dL — ABNORMAL HIGH (ref 70–99)
Potassium: 3.1 mmol/L — ABNORMAL LOW (ref 3.5–5.1)
Sodium: 154 mmol/L — ABNORMAL HIGH (ref 135–145)

## 2018-09-24 LAB — GLUCOSE, CAPILLARY
Glucose-Capillary: 221 mg/dL — ABNORMAL HIGH (ref 70–99)
Glucose-Capillary: 244 mg/dL — ABNORMAL HIGH (ref 70–99)
Glucose-Capillary: 184 mg/dL — ABNORMAL HIGH (ref 70–99)

## 2018-09-24 LAB — CBC
HCT: 30.9 % — ABNORMAL LOW (ref 36.0–46.0)
Hemoglobin: 9.4 g/dL — ABNORMAL LOW (ref 12.0–15.0)
MCH: 28.2 pg (ref 26.0–34.0)
MCHC: 30.4 g/dL (ref 30.0–36.0)
MCV: 92.8 fL (ref 80.0–100.0)
Platelets: 236 10*3/uL (ref 150–400)
RBC: 3.33 MIL/uL — ABNORMAL LOW (ref 3.87–5.11)
RDW: 13.9 % (ref 11.5–15.5)
WBC: 9 10*3/uL (ref 4.0–10.5)
nRBC: 0 % (ref 0.0–0.2)

## 2018-09-24 LAB — SODIUM: Sodium: 153 mmol/L — ABNORMAL HIGH (ref 135–145)

## 2018-09-24 LAB — PROCALCITONIN: Procalcitonin: 0.16 ng/mL

## 2018-09-24 MED ORDER — DIPHENHYDRAMINE HCL 50 MG/ML IJ SOLN: 25.0000 mg | INTRAMUSCULAR | Status: DC | PRN

## 2018-09-24 MED ORDER — GLYCOPYRROLATE 0.2 MG/ML IJ SOLN
0.2000 mg | INTRAMUSCULAR | Status: DC | PRN
  Administered 2018-09-24: 0.2 mg via INTRAVENOUS
  Filled 2018-09-24: qty 1

## 2018-09-24 MED ORDER — MORPHINE BOLUS VIA INFUSION
5.0000 mg | INTRAVENOUS | Status: DC | PRN
  Filled 2018-09-24: qty 5

## 2018-09-24 MED ORDER — POLYVINYL ALCOHOL 1.4 % OP SOLN: 1.0000 [drp] | Freq: Four times a day (QID) | OPHTHALMIC | Status: DC | PRN

## 2018-09-24 MED ORDER — GLYCOPYRROLATE 0.2 MG/ML IJ SOLN: 0.2000 mg | INTRAMUSCULAR | Status: DC | PRN

## 2018-09-24 MED ORDER — DEXTROSE 5 % IV SOLN: INTRAVENOUS | Status: DC

## 2018-09-24 MED ORDER — MORPHINE 100MG IN NS 100ML (1MG/ML) PREMIX INFUSION
0.0000 mg/h | INTRAVENOUS | Status: DC
  Administered 2018-09-24: 15 mg/h via INTRAVENOUS
  Administered 2018-09-24: 5 mg/h via INTRAVENOUS
  Administered 2018-09-25 (×4): 20 mg/h via INTRAVENOUS
  Filled 2018-09-24 (×8): qty 100

## 2018-09-24 MED ORDER — GLYCOPYRROLATE 1 MG PO TABS
1.0000 mg | ORAL_TABLET | ORAL | Status: DC | PRN
  Filled 2018-09-24: qty 1

## 2018-09-24 MED ORDER — ACETAMINOPHEN 325 MG PO TABS: 650.0000 mg | ORAL_TABLET | Freq: Four times a day (QID) | ORAL | Status: DC | PRN

## 2018-09-24 MED ORDER — ACETAMINOPHEN 650 MG RE SUPP: 650.0000 mg | Freq: Four times a day (QID) | RECTAL | Status: DC | PRN

## 2018-09-24 MED ORDER — MORPHINE SULFATE (PF) 2 MG/ML IV SOLN: 2.0000 mg | INTRAVENOUS | Status: DC | PRN

## 2018-09-24 NOTE — Progress Notes (Signed)
Sophia Peterson with CDS pt is not a DCD candidate. Advised to call with time of death.

## 2018-09-24 NOTE — Progress Notes (Addendum)
STROKE TEAM PROGRESS NOTE   SUBJECTIVE (INTERVAL HISTORY) The patient remains unresponsive and is not following commands.  Ventriculostomy drainage remains tenuous.  She is spiking temperature 101.3.  Blood pressure is adequately controlled.  Serum sodium is optimal at 154.  She remains on ventilatory support and respiratory failure.  OBJECTIVE Vitals:   09/24/18 0600 09/24/18 0700 09/24/18 0751 09/24/18 0800  BP: 119/66 (!) 122/54 (!) 128/58 (!) 129/54  Pulse: 84 80  91  Resp: (!) 22 19  20   Temp: (!) 101.3 F (38.5 C) (!) 100.8 F (38.2 C)  (!) 100.8 F (38.2 C)  TempSrc:    Core  SpO2: 99% 99% 100% 100%  Weight:      Height:        CBC:  Recent Labs  Lab 10/07/2018 2215  09/22/18 0254 09/23/18 0044 09/23/18 0411 09/24/18 0432  WBC 12.1*  --  11.6* 12.4*  --  9.0  NEUTROABS 9.1*  --  10.0*  --   --   --   HGB 10.4*   < > 9.8* 10.5* 10.2* 9.4*  HCT 32.1*   < > 29.7* 32.5* 30.0* 30.9*  MCV 88.2  --  87.1 89.3  --  92.8  PLT 304  --  236 254  --  236   < > = values in this interval not displayed.    Basic Metabolic Panel:  Recent Labs  Lab 09/23/18 0044 09/23/18 0411  09/24/18 0018 09/24/18 0432  NA 146* 150*   < > 153* 154*  K 4.1 3.5  --   --  3.1*  CL 111  --   --   --  121*  CO2 21*  --   --   --  23  GLUCOSE 191*  --   --   --  263*  BUN 13  --   --   --  21  CREATININE 1.07*  --   --   --  1.11*  CALCIUM 8.8*  --   --   --  8.8*   < > = values in this interval not displayed.    Lipid Panel:     Component Value Date/Time   CHOL 85 09/23/2018 0044   TRIG 92 09/23/2018 0044   HDL 43 09/23/2018 0044   CHOLHDL 2.0 09/23/2018 0044   VLDL 18 09/23/2018 0044   LDLCALC 24 09/23/2018 0044   HgbA1c:  Lab Results  Component Value Date   HGBA1C 9.3 (H) 10/03/2018   Urine Drug Screen:     Component Value Date/Time   LABOPIA NONE DETECTED 09/22/2018 0212   COCAINSCRNUR NONE DETECTED 09/22/2018 0212   LABBENZ POSITIVE (A) 09/22/2018 0212   AMPHETMU NONE  DETECTED 09/22/2018 0212   THCU NONE DETECTED 09/22/2018 0212   LABBARB NONE DETECTED 09/22/2018 0212    Alcohol Level     Component Value Date/Time   ETH <10 10/06/2018 2215    IMAGING Ct Head Wo Contrast 09/22/2018 2235 1. EVD position appears stable although ventricle size has significantly increased since this morning, with mild transependymal edema suspected now. 2. Hemorrhage in the left thalamus and brainstem appears slightly larger since presentation yesterday (17 mL now vs. 13 mL yesterday when measured in the same way). 3. Stable volume of intraventricular and subarachnoid hemorrhage.   Ct Head Wo Contrast 09/22/2018 1045 1. Mild dilatation of the right greater than left lateral ventricles, decreased status post right frontal approach ventriculostomy catheter placement, with catheter traversing the body of the  right lateral ventricle and terminating in the midline. 2. Hyperdense left thalamic/left midbrain hemorrhage is slightly increased. Stable subarachnoid hemorrhage in basilar cisterns. Stable fourth ventricle and cerebral aqueduct hemorrhage. 3. Right midline shift 4 mm, previously 5 mm using similar measurement technique, not significantly changed.   Ct Head Wo Contrast 09/22/2018 0242 1. Interval increased size of both lateral and third ventricles compatible with developing obstructive hydrocephalus.  2. Stable acute hemorrhage centered within left thalamus extending into left midbrain and upper pons. Stable extra-axial hemorrhage in fourth ventricle and posterior fossa cisterns.  Ct Angio Head W Or Wo Contrast Ct Angio Neck W Or Wo Contrast 09/20/2018 1. Patent carotid and vertebral arteries. No dissection, aneurysm, or hemodynamically significant stenosis utilizing NASCET criteria.  2. Patent anterior and posterior intracranial circulation. No large vessel occlusion, aneurysm, or vascular malformation.  3. Mild asymmetric attenuation of left PCA, possibly early vasospasm.  4.  Acute hemorrhage within left thalamus extending into brainstem as well as the ventricular system and basilar subarachnoid space is grossly stable from prior CT head.   Ct Cervical Spine Wo Contrast 10/14/2018 1. No acute fracture or subluxation of the cervical spine.  2. Multilevel degenerative disc disease, most prominent at C5-C6.   Ct Head Code Stroke Wo Contrast  10/06/2018 1. Acute intra-axial hemorrhage centered at the left thalamus with caudal extension into the midbrain, as well as extension into the ventricular system AND subarachnoid spaces.  2. Estimated intra-axial blood volume 10 mL. Regional edema and mild regional mass effect.   Dg Pelvis Portable 10/02/2018 Negative.   Dg Chest Portable 1 View 09/24/2018 1. Left PICC line noted with tip over cavoatrial junction. Endotracheal tube and NG tube in stable position.  2. Bibasilar atelectasis again noted. Slight improvement from prior exam. Tiny left pleural effusion again noted without change. 09/23/2018 1. Progressive airspace opacity at the left base. While this may represent atelectasis, infection or aspiration is considered. 2. New left pleural effusion. 3. Increased linear opacity in the right lower lobe likely reflects atelectasis.  10/12/2018 1. Endotracheal tube tip about 11 mm superior to carina  2. Low lung volumes with linear atelectasis or scarring at the bases. Slightly more patchy and focal opacity in the left infrahilar lung may reflect atelectasis, pneumonia or aspiration   EKG - SR rate 82 BPM. (See cardiology reading for complete details)   PHYSICAL EXAM  Blood pressure (!) 129/54, pulse 91, temperature (!) 100.8 F (38.2 C), temperature source Core, resp. rate 20, height 5\' 2"  (1.575 m), weight 70 kg, SpO2 100 %. Frail middle-aged lady who is intubated and not sedated. . Afebrile. Head is nontraumatic. Neck is supple without bruit.    Cardiac exam no murmur or gallop. Lungs are clear to auscultation. Distal pulses  are well felt.  She has right frontal ventriculostomy catheter Neurological Exam :  Patient is intubated.  Not on sedation.  Eyes are closed.  Opens eyes partially to sternal rub.  She does not follow any commands.  Left gaze deviation.  Doll's eye movements are sluggish pupils are 3 mm sluggishly reactive.  Corneal reflexes are present.  She does have a weak cough and gag.  Motor system exam shows partial flexion response of the right greater than left upper extremity to sternal rub.  There is trace withdrawal in lower extremities with partial flexion left greater than right.   ASSESSMENT/PLAN Sophia Peterson is a 68 y.o. female with history of hypertension, diabetes, and hyperlipidemia presenting with collapse, right  sided weakness, right gaze preference and vomiting. She did not receive IV t-PA due to [place].  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  CTA H&N - No large vessel occlusion, aneurysm, or vascular malformation. Mild asymmetric attenuation of left PCA, possibly early vasospasm.   CT head 7/5 - Interval increased size of both lateral and third ventricles compatible with developing obstructive hydrocephalus.   Repeat CT head 7/5 mild dilated R>L lateral ventricles s/p EVD. Hyperdense L thalamic/L midbrain hmg. Stable SAH. R MLS 72mm  Repeat CT head 7/5 EVD in placed, increased size L ventricle, Hmg slightly larger 13->17 mL  tPA into EVD 7/6  Carotid Doppler - CTA neck performed - carotid dopplers not indicated.  2D Echo - not indicated  [employer] Virus 2  - negative  LDL - 24  HgbA1c - 9.3  UDS - benzodiazepines  VTE prophylaxis - SCDs  Diet - NPO  aspirin 81 mg daily prior to admission, now on No  antithrombotic given hmg  Therapy recommendations:  pending  Disposition:  Pending  Dr. Leonie Man spoke with family.  They plan withdrawal of care.  They will arrive at 12 noon today.  Acute Respiratory Failure  Intubated  Mental status limiting factor for extubation  CCM on board  Leukocytosis and Fever  TM 101.3  WBC 12.4->9.0  CXR bibasilar atx w/ slight improvement. Tiny L pleural effusion w/o change  Not on abx  Cultures pending   Obstructive Hydrocephalus Induced Hypernatremia Cerebral Edema  EVD placed 7/5 (Nundkumar)  Started on 3% protocol at [facility], now off  For PICC placement today  Na 154  Check Na q6h  Goal Na 150-155  Hypertension  Highest recorded BP 152/78  Home meds:  Valsartan-HCTZ 160-12.5 daily  BP Stable . Systolic blood pressure goal < 160 mmHg . On no BP meds . Long-term BP goal normotensive  Hyperlipidemia  Home Meds: crestor 10  LDL 24  crestor held given hmg and risk of hmg w/ low LDL  Diabetes  Home meds: metformin 1000 bid, actor 15  HgbA1c 9.3, goal < 7.0  Uncontrolled  SSI  CBG  Other Stroke Risk Factors  Advanced age  Cigarette smoker - not on file  ETOH - not on file  Overweight, Body mass index is 28.23 kg/m., recommend weight loss, diet and exercise as appropriate   Family hx stroke - not on file  Other Active Problems  Fall, cervical brace in place  Anemia - Hb - 10.4->10.2->9.8->10.5->9.4  Creatinine - 1.18->1.10->1.08->1.07->1.11  Hospital day # 3 The patient's neurological condition remains poor with lack of significant improvement over the last several days despite ventriculostomy.  She is unlikely to survive without prolonged ventilatory support and nutritional support and likely need tracheostomy PEG tube and prolonged nursing home stay.  I spoke to patient's son Mortimer Fries over the phone and they were very clear patient would not want this.  They agreed to withdrawal of ventilatory support,  DNR and comfort care measures.  I am in agreement with the plan. Recommend DNR and withdrawal of ventilatory support and full comfort care measures.  Discussed with Dr. Elsworth Soho.This patient is critically ill and at significant risk of neurological worsening, death and care requires constant monitoring  of vital signs, hemodynamics,respiratory and cardiac monitoring, extensive review of multiple databases, frequent neurological assessment, discussion with family, other specialists and medical decision making of high complexity.I have made any additions or clarifications directly to the above note.This critical care time does not reflect procedure time, or teaching time or supervisory time of PA/NP/Med Resident etc but could involve care discussion time.  I spent 30 minutes of neurocritical care time  in the care of  this patient.     Antony Contras, MD Medical Director [place] Pager: [phone] 09/24/2018 10:07 AM   To contact Stroke Continuity provider, please refer to [email or web address] After hours, contact General Neurology

## 2018-09-24 NOTE — Progress Notes (Signed)
NAME:  Sophia Peterson, MRN:  [id number], DOB:  21-Jul-1950, LOS: 3 ADMISSION DATE:  10/13/2018, CONSULTATION DATE:  09/24/2018 REFERRING MD:  Ronnald Nian, CHIEF COMPLAINT:  Altered mental status  Brief History   68 yo with L thalamic intracranial hemorrhage.   History of present illness   Patient is a 68 yo female with history HTN who fell in her garden, went inside with altered mental status.  On arrival to ER not moving the R side with right deviated gaze.  She vomited on arrival to ER.   Past Medical History   HTN, type 2 DM, HLD  Significant Hospital Events   Admission, intubation 09/20/2018  Consults:  N/A  Procedures:  7/4 ETT >> 7/5 ventric >> 7/6 L PICC >>  Significant Diagnostic Tests:  Head CT 7/4 >> Acute intra-axial hemorrhage centered at the left thalamus with caudal extension into the midbrain, as well as extension into the ventricular system AND subarachnoid spaces.  Head CT 7/5 >> Interval increase in size of the lateral and third ventricles consistent with developing obstructive hydrocephalus. Stable mass effect with midline shift of 9 mm, left to right, at the level of third ventricle  Head CT 7/5 >> Interval increase in ventricle size despite stable EVD position. Interval increase in L thalamic and brainstem hemorrhage (17 mL from 5mL). Stable IVH and SAH.   Micro Data:  7/6 Respiratory Cx >> 7/6 Blood Cx >>  Antimicrobials:  N/A  Interim history/subjective:  Family has decided to withdraw care this afternoon. EVD appears to have stopped draining again; Neuro aware. Patient continues to exhibit decerebrate posturing. Continues to fever despite Tylenol. Remains critically ill and unresponsive,  Objective   Blood pressure (!) 129/54, pulse 91, temperature (!) 100.8 F (38.2 C), temperature source Core, resp. rate 20, height 5\' 2"  (1.575 m), weight 70 kg, SpO2 100 %.    Vent Mode: PRVC FiO2 (%):  [30 %] 30 % Set Rate:  [16 bmp] 16 bmp Vt Set:  [400 mL] 400  mL PEEP:  [5 cmH20] 5 cmH20 Plateau Pressure:  [12 cmH20-16 cmH20] 15 cmH20   Intake/Output Summary (Last 24 hours) at 09/24/2018 0937 Last data filed at 09/24/2018 0800 Gross per 24 hour  Intake 924.45 ml  Output 1124 ml  Net -199.55 ml   Filed Weights   09/22/18 0305 09/23/18 0409 09/24/18 0438  Weight: 72.9 kg 71.1 kg 70 kg    Examination: General: Ill-appearing, well-developed woman, on vent HENT: Pupils equal and nonreactive to light. No scleral icterus. Lungs: CTA anteriorly Cardiovascular: RRR, no m/r/g Abdomen: Soft and non-distended. Extremities: Warm. No peripheral edema Neuro: Unresponsive. Decerebrate posturing R>L. Hyperreflexic throughout. +Babinski GU: Foley in place draining amber-colored urine  Chest x-ray 7/7 personally reviewed: ET tube in position, stable plate-like atelectasis in RLL, better visualization and less concern for retrocardiac infiltrates.  Resolved Hospital Problem list   N/A  Assessment & Plan:   Neurological L Thalamic Intracranial Hemorrhage: - CT Brain - acute left thalamic bleed with caudal extension into the midbrain as well as extension into the ventricular system and subarachnoid spaces bilaterally.  - s/p ventriculostomy for increasing hydrocephalus on 7/5 - EVD at 10 cmH2O, not currently draining, management per Neurosurgery - Na+ therapeutic, check q6hr - Stopped Hypertonic saline  Cardiovascular HTN: Normotensive at present - Goal SBP <140, per Neuro - hydralazine and labetalol available PRN  Respiratory Acute Respiratory Failure: - Vent settings reviewed and adjusted - Unable to extubate 2/2 mental status  Atelectasis: -  CXR with plate-like atelectasis in RLL - Mechanical ventilation as above  Endocrine T2DM: Hgb A1C 9.3% - CBG 222>221>244 - SSI  Infectious Fever  Leukocytosis: Likely 2/2 brain irritation from intracranial hemorrhage - Low concern for L base infection or aspiration on CXR. Less concerning on  film review. - f/u Respiratory and Blood cultures - Pro-calcitonin low. Antibiotics not indicated at this time  Dispo: Poor prognosis given worsening mental status and signs of increasing intracranial pressure. Plan to withdraw care 7/7.    Best practice:  Diet: NPO Pain/Anxiety/Delirium protocol (if indicated): Fentanyl prn VAP protocol (if indicated): per protocol DVT prophylaxis: SCDs GI prophylaxis: Protonix Glucose control: per protocol Mobility: Bed rest Code Status: Full Family Communication: family per neurology Disposition: ICU   Critical care time: 35 minutes   -- Armida Sans, [facility] 09/24/2018

## 2018-09-24 NOTE — Progress Notes (Signed)
Inpatient Diabetes Program Recommendations  AACE/ADA: New Consensus Statement on Inpatient Glycemic Control (2015)  Target Ranges:  Prepandial:   less than 140 mg/dL      Peak postprandial:   less than 180 mg/dL (1-2 hours)      Critically ill patients:  140 - 180 mg/dL   Results for Sophia Peterson, Sophia Peterson (MRN [id number]) as of 09/24/2018 11:53  Ref. Range 09/23/2018 08:26 09/23/2018 12:03 09/23/2018 15:11 09/23/2018 19:51 09/23/2018 23:37 09/24/2018 04:04 09/24/2018 07:39 09/24/2018 11:16  Glucose-Capillary Latest Ref Range: 70 - 99 mg/dL 140 (H) 122 (H) 136 (H) 169 (H) 222 (H) 221 (H) 244 (H) 184 (H)   Review of Glycemic Control  Diabetes history: DM 2  Current orders for Inpatient glycemic control:  Novolog 0-15 units Q4  Inpatient Diabetes Program Recommendations:    Tube feeds started glucose trends increased into the 200's. Consider adding Novolog 3 units Q4 Tube Feed Coverage.  Thanks,  Tama Headings RN, MSN, BC-ADM Inpatient Diabetes Coordinator Team Pager [phone] (8a-5p)

## 2018-09-24 NOTE — Progress Notes (Signed)
Pt 0800 assessment EVD not pulsating. Dr. Leonie Man on unit and made aware. Plan is to withdraw care. No new orders will continue to monitor.

## 2018-09-24 NOTE — Progress Notes (Signed)
PT Cancellation Note  Patient Details Name: Sophia Peterson MRN: [id number] DOB: August 31, 1950   Cancelled Treatment:    Reason Eval/Treat Not Completed: Other (comment). Pt with poor prognosis and family plans to withdraw care around [place] today. Acute PT order cancelled.  Kittie Plater, PT, DPT Acute Rehabilitation Services Pager #: [phone] Office #: [phone]    Berline Lopes 09/24/2018, 7:58 AM

## 2018-09-24 NOTE — Progress Notes (Signed)
Dr. Ellene Route stopped by to pull EVD at request of Dr. Leonie Man. Pt is comfort care and family wishes to remain at bedside at this time and drain not to be pulled. Pt sats 48% and on morphine gtt. RN able to pull drain when pt passes. Will continue to monitor.

## 2018-09-24 NOTE — Progress Notes (Signed)
OT Discharge Note  Patient Details Name: Sophia Peterson MRN: [id number] DOB: 06-21-1950   Cancelled Treatment:    Reason Eval/Treat Not Completed: Medical issues which prohibited therapy - pt now for withdrawal of care.  OT will sign off at this time.  Lucille Passy, OTR/L Acute Rehabilitation Services Pager [phone] Office [phone]   Lucille Passy M 09/24/2018, 10:22 AM

## 2018-09-24 NOTE — Procedures (Signed)
Extubation Procedure Note  Patient Details:   Name: Sophia Peterson DOB: 06/09/1950 MRN: [id number]   Airway Documentation:    Vent end date: 09/24/18 Vent end time: 1445   Evaluation  O2 sats: currently acceptable Complications: No apparent complications Patient did tolerate procedure well. Bilateral Breath Sounds: Diminished   Pt extubated to comfort care per physician order and family request. Pt suctioned via ETT and orally prior. RN at bedside during extubation. Family at bedside post extubation.   Jonathon Jordan Jaana Brodt 09/24/2018, 2:50 PM

## 2018-09-24 NOTE — Progress Notes (Signed)
68 year old with acute left thalamic bleed with caudal extension into the midbrain as well as extension into the ventricular system and subarachnoid spaces bilaterally.   Status post ventriculostomy for increasing hydrocephalus   Remains febrile, critically ill, intubated Mental status remains poor, GCS of 3, decerebrate posturing to deep pain stimulus, pupils 2 mm not reactive to light bilateral, clear breath sounds bilateral, minimal secretions, S1-S2 normal, soft nontender abdomen.  Labs reviewed which show hyponatremia and hypokalemia, no leukocytosis and low normal procalcitonin.  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  Orders written signed and held  The patient is critically ill with multiple organ systems failure and requires high complexity decision making for assessment and support, frequent evaluation and titration of therapies, application of advanced monitoring technologies and extensive interpretation of multiple databases. Critical Care Time devoted to patient care services described in this note independent of APP/resident  time is 31 minutes.    Leanna Sato Elsworth Soho MD

## 2018-09-28 LAB — CULTURE, BLOOD (ROUTINE X 2)
Culture: NO GROWTH
Culture: NO GROWTH
Special Requests: ADEQUATE
Special Requests: ADEQUATE

## 2018-10-01 ENCOUNTER — Telehealth: Payer: Self-pay | Admitting: Neurology

## 2018-10-01 NOTE — Telephone Encounter (Signed)
I returned the patient's husband all and explained to him that I feel the primary cause of death was intracerebral hemorrhage which made the patient fall and she sustained a bruise on his forehead and the primary fall was not the cause of the death.  He expressed understanding.

## 2018-10-01 NOTE — Telephone Encounter (Signed)
Pt's husband called and is wanting to speak to the probvider about the pt's death certificate and the ruling on it. Husband is needing information for the [facility] and the Insurance. Please advise.

## 2018-10-19 NOTE — Progress Notes (Signed)
Nutrition Brief Note  Chart reviewed. Pt now transitioning to comfort care.  No further nutrition interventions warranted at this time.  Please re-consult as needed.   Ulis Kaps A. Sharron, RD, LDN, CDCES Registered Dietitian II Certified Diabetes Care and Education Specialist Pager: [phone] After hours Pager: [phone]  

## 2018-10-19 NOTE — Progress Notes (Signed)
STROKE TEAM PROGRESS NOTE   SUBJECTIVE (INTERVAL HISTORY) Patient made comfort care measures only yesterday by family.  She is presently on morphine drip and resting comfortably.  Vital signs appear stable but temperature is up to 101.3  OBJECTIVE Vitals:   09/24/18 1300 09/24/18 1400 09/24/18 1500 2018-09-29 0800  BP: 113/60 129/63    Pulse: 80 78 88 77  Resp: 17 18 17    Temp: (!) 101.3 F (38.5 C) (!) 101.1 F (38.4 C) (!) 101.2 F (38.4 C) (!) 97.3 F (36.3 C)  TempSrc:      SpO2: 98% 98% (!) 89% (!) 66%  Weight:      Height:        PHYSICAL EXAM   Frail middle-aged lady     Head is nontraumatic. Neck is supple without bruit.    Cardiac exam no murmur or gallop. Lungs are clear to auscultation. Distal pulses are well felt.  Neurological Exam :  Patient is  resting peacefully on morphine drip..  Eyes are closed.  Opens eyes partially to sternal rub.  She does not follow any commands.  Left gaze deviation.  Doll's eye movements are sluggish Motor system exam shows partial flexion response of the right greater than left upper extremity to sternal rub.  There is trace withdrawal in lower extremities with partial flexion left greater than right.   ASSESSMENT/PLAN [name] is a 68 y.o. female with history of hypertension, diabetes, and hyperlipidemia presenting with collapse, right sided weakness, right gaze preference and vomiting. She did not receive IV t-PA due to [place].  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  CTA H&N - No large vessel occlusion, aneurysm, or vascular malformation. Mild asymmetric attenuation of left PCA, possibly early vasospasm.   CT head 7/5 - Interval increased size of both lateral and third  ventricles compatible with developing obstructive hydrocephalus.   Repeat CT head 7/5 mild dilated R>L lateral ventricles s/p EVD. Hyperdense L thalamic/L midbrain hmg. Stable SAH. R MLS 89mm  Repeat CT head 7/5 EVD in placed, increased size L ventricle, Hmg slightly larger 13->17 mL  tPA into EVD 7/6  Carotid Doppler - CTA neck performed - carotid dopplers not indicated.  2D Echo - not indicated  [employer] Virus 2  - negative  LDL - 24  HgbA1c - 9.3  UDS - benzodiazepines  aspirin 81 mg daily prior to admission, now on No antithrombotic given hmg  Disposition:  Pending  Terminal wean yesterday. VS remain stable. Transfer to floor for comfort care  SW consult for disposition - residential hospice vs home with hospice  Acute Respiratory Failure  Intubated  Terminal wean 7/7  Comfort care  Leukocytosis and Fever  TM 101.3  WBC 12.4->9.0  CXR bibasilar atx w/ slight improvement. Tiny L pleural effusion w/o change  Not on abx  Cultures no growth x2 d   Obstructive Hydrocephalus Induced Hypernatremia Cerebral Edema  EVD placed 7/5 (Nundkumar)  Treated with 3% protocol   Hypertension  Highest recorded BP 152/78  Home meds:  Valsartan-HCTZ 160-12.5 daily  BP Stable  Hyperlipidemia  Home Meds: crestor 10  LDL 24  crestor held given hmg and risk of hmg w/ low LDL  Diabetes, uncontrolled  Home meds: metformin 1000 bid, actor 15  HgbA1c 9.3, goal < 7.0  Other Stroke Risk Factors  Advanced age  Overweight, Body mass index is 28.23 kg/m., recommend weight loss, diet and exercise as appropriate   Other Active Problems  Fall, cervical brace in place  Anemia   CKD  Hospital day # 4 Patient seems comfortable on morphine drip and is comfort care measures only as per family wishes.  Plan transfer to hospice unit today and consider transfer to hospice nursing home over the next few days if family is agreeable.  Antony Contras, MD Medical  Director [facility] Pager: [phone] October 22, 2018 11:00 AM   To contact Stroke Continuity provider, please refer to [email or web address] After hours, contact General Neurology

## 2018-10-19 NOTE — Progress Notes (Signed)
Spoke with ME at [phone]. Patient is not an ME case candidate, per on call ME.

## 2018-10-19 NOTE — Progress Notes (Signed)
Wasted 4mls of Morphine with My. Janina Mayo, RN.

## 2018-10-19 NOTE — Discharge Summary (Signed)
Patient ID: Sophia Peterson MRN: [id number] DOB/AGE: [birth year] 68 y.o.  Admit date: 10/19/2018 Death date: 24-Oct-2018  Admission Diagnoses:Fall, altered mental status  Cause of Death: Respiratory failure secondary to large left thalamus ICH w/ IVH and SAH s/p EVD  Patient made DNR and comfort care by family and ventilator support withdrawn pertinent Medical Diagnosis: Active Problems:   ICH (intracerebral hemorrhage) ([place])   Intracranial hemorrhage ([place]) Obstructive hydrocephalus Cytotoxic edema Respiratory failure  Hospital Course: Sophia Peterson is a 68 y.o. female past medical history of hypertension, diabetes, hyperlipidemia, in her usual state of health till about 9:15 PM when she went into her garden and had a sudden onset of collapse. She was brought into the hospital via [facility] as a level 2 trauma because of having sustained a fall but on initial evaluation by the ED provider and trauma doctors, she is not moving the right side of her body as much as the left and had a gaze to the right for which a code stroke was activated after discussing with Dr Rory Percy neurohospitalist on call.  Her NIH stroke scale on admission was 13 and ICH score was 2.CT-scan of the brain- acute left thalamic bleed with caudal extension into the midbrain as well as extension into the ventricular system and subarachnoid spaces bilaterally.  Volume 10 cc with regional edema and mild regional mass-effect.  She was intubated for airway protection and admitted to the intensive care unit for blood pressure was tightly controlled.  Urgent neurosurgery consult was obtained and she underwent bedside ventriculostomy catheter placement for drainage.  CT angiogram showed no large vessel stenosis occlusion or aneurysm or AV malformation.  Patient ventriculostomy drainage was not optimal and she developed decreased drainage with neurological worsening.  Neurosurgery placed TPA into the external ventricular drain with  partial improvement of drainage.  Her neurological exam however remained poor.  Family understood that patient would likely need prolonged ventilatory support, tracheostomy, PEG tube and nursing support in the nursing home.  They felt patient would not have wanted that.  They made the patient DNR and comfort care and so ventilatory support was withdrawn.  Ventriculostomy catheter was removed.  She was kept comfortable on IV morphine drip.  Her condition gradually declined and she passed away peacefully. Signed: Antony Contras 10-24-18, 3:57 PM

## 2018-10-19 DEATH — deceased

## 2020-10-29 IMAGING — CR PORTABLE CHEST - 1 VIEW
1 series · 1 of 1 positions shown · non-contrast
Comparison: One-view chest x-ray 09/21/2018

CLINICAL DATA: CVA.

EXAM:
PORTABLE CHEST 1 VIEW

[AP]
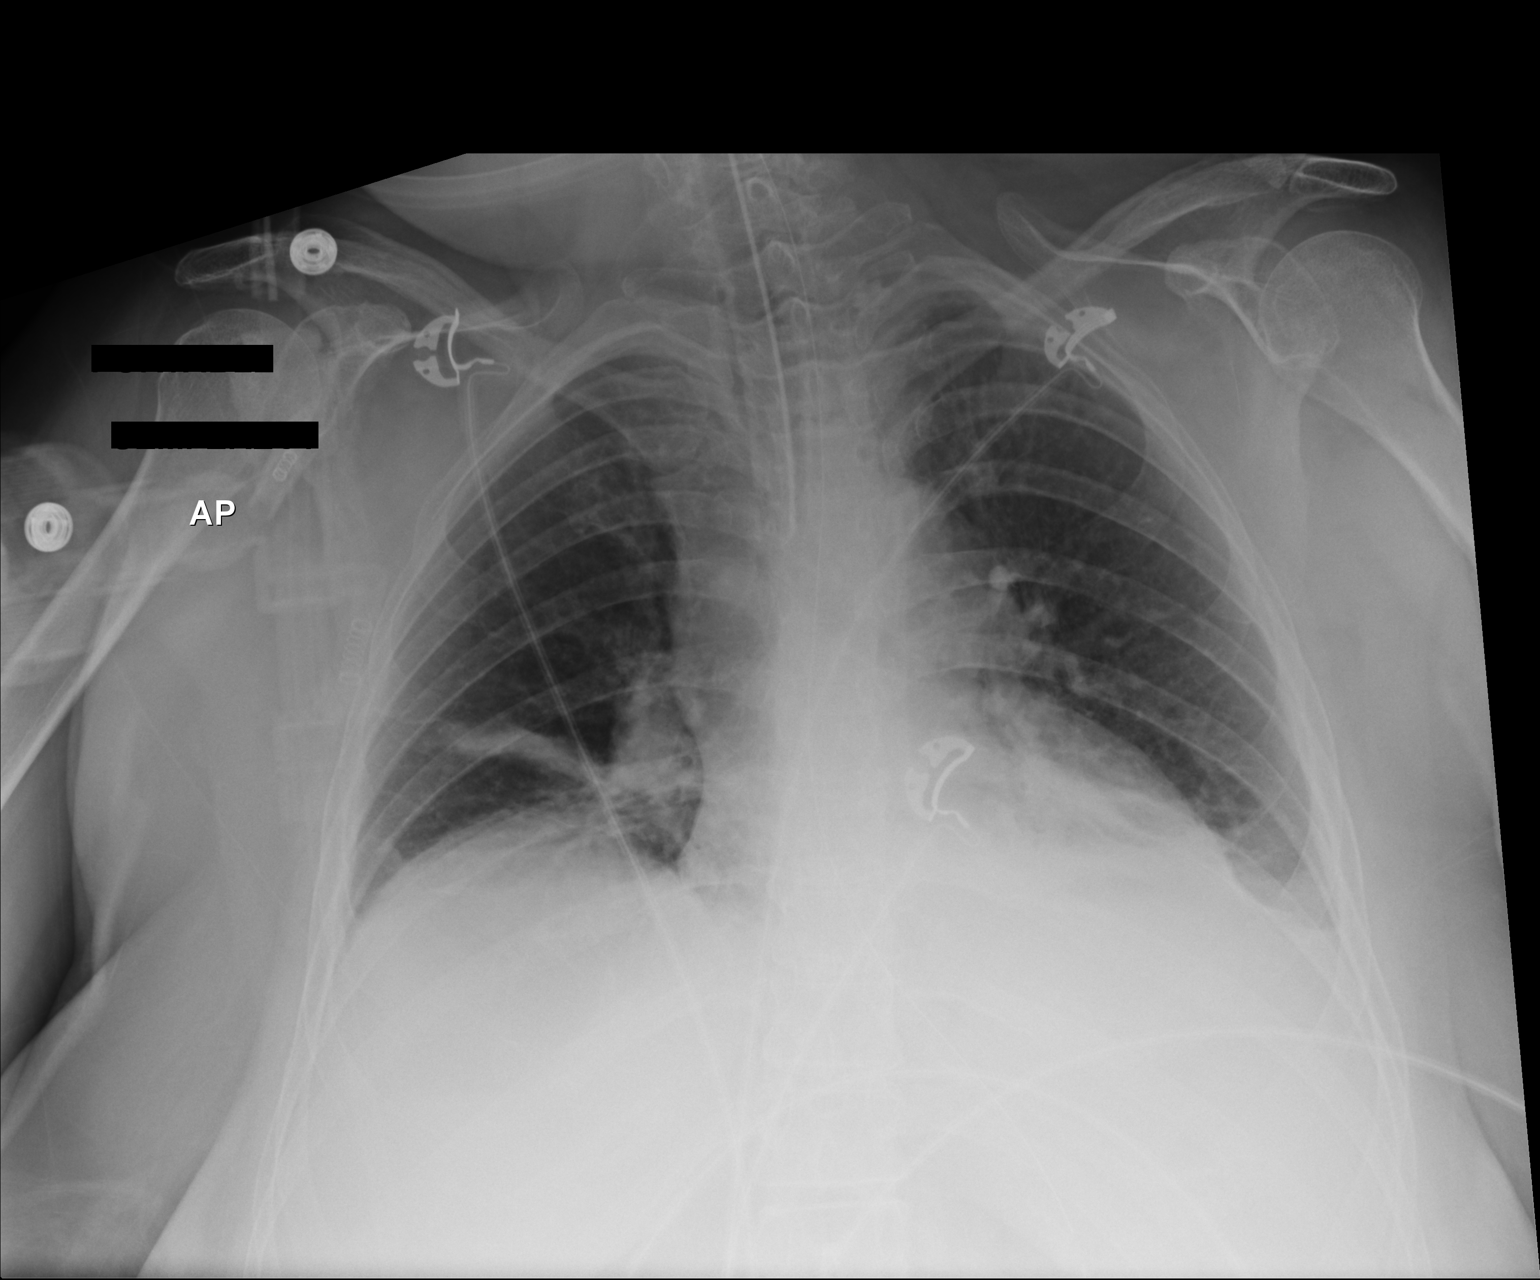

[1 of 1 positions shown; findings below may reference images not displayed]

FINDINGS: Endotracheal tube terminates 2 cm above the carina, unchanged.
Enteric tube courses off the inferior border of the film.

There is increased consolidation at the left base. Left effusion is
noted. Linear opacity in the right lower lobe likely reflects
atelectasis. Upper lung fields are clear bilaterally.
IMPRESSION: 1. Progressive airspace opacity at the left base. While this may
represent atelectasis, infection or aspiration is considered.
2. New left pleural effusion.
3. Increased linear opacity in the right lower lobe likely reflects
atelectasis.
